# Patient Record
Sex: Male | Born: 1950 | Race: Black or African American | Hispanic: No | Marital: Married | Smoking: Former smoker
Health system: Southern US, Community
[De-identification: ages and names within clinical notes are randomized; demographics above are authoritative.]

## PROBLEM LIST (undated history)

## (undated) DIAGNOSIS — E119 Type 2 diabetes mellitus without complications: Secondary | ICD-10-CM

## (undated) DIAGNOSIS — R0602 Shortness of breath: Secondary | ICD-10-CM

## (undated) DIAGNOSIS — E876 Hypokalemia: Secondary | ICD-10-CM

## (undated) DIAGNOSIS — K766 Portal hypertension: Secondary | ICD-10-CM

## (undated) DIAGNOSIS — N189 Chronic kidney disease, unspecified: Secondary | ICD-10-CM

## (undated) DIAGNOSIS — N529 Male erectile dysfunction, unspecified: Secondary | ICD-10-CM

## (undated) DIAGNOSIS — I2510 Atherosclerotic heart disease of native coronary artery without angina pectoris: Secondary | ICD-10-CM

## (undated) DIAGNOSIS — R188 Other ascites: Secondary | ICD-10-CM

## (undated) DIAGNOSIS — D631 Anemia in chronic kidney disease: Secondary | ICD-10-CM

## (undated) DIAGNOSIS — I8500 Esophageal varices without bleeding: Secondary | ICD-10-CM

## (undated) DIAGNOSIS — N2581 Secondary hyperparathyroidism of renal origin: Secondary | ICD-10-CM

## (undated) DIAGNOSIS — K279 Peptic ulcer, site unspecified, unspecified as acute or chronic, without hemorrhage or perforation: Secondary | ICD-10-CM

## (undated) DIAGNOSIS — I959 Hypotension, unspecified: Secondary | ICD-10-CM

## (undated) DIAGNOSIS — Z992 Dependence on renal dialysis: Secondary | ICD-10-CM

## (undated) DIAGNOSIS — N289 Disorder of kidney and ureter, unspecified: Secondary | ICD-10-CM

## (undated) DIAGNOSIS — D649 Anemia, unspecified: Secondary | ICD-10-CM

## (undated) DIAGNOSIS — K922 Gastrointestinal hemorrhage, unspecified: Secondary | ICD-10-CM

## (undated) DIAGNOSIS — L299 Pruritus, unspecified: Secondary | ICD-10-CM

## (undated) DIAGNOSIS — K7460 Unspecified cirrhosis of liver: Secondary | ICD-10-CM

## (undated) DIAGNOSIS — E162 Hypoglycemia, unspecified: Secondary | ICD-10-CM

## (undated) DIAGNOSIS — R519 Headache, unspecified: Secondary | ICD-10-CM

## (undated) DIAGNOSIS — I10 Essential (primary) hypertension: Secondary | ICD-10-CM

## (undated) DIAGNOSIS — B1920 Unspecified viral hepatitis C without hepatic coma: Secondary | ICD-10-CM

## (undated) DIAGNOSIS — M810 Age-related osteoporosis without current pathological fracture: Secondary | ICD-10-CM

## (undated) DIAGNOSIS — I739 Peripheral vascular disease, unspecified: Secondary | ICD-10-CM

## (undated) DIAGNOSIS — N186 End stage renal disease: Secondary | ICD-10-CM

## (undated) DIAGNOSIS — R51 Headache: Secondary | ICD-10-CM

## (undated) HISTORY — PX: ESOPHAGOGASTRODUODENOSCOPY: SHX1529

## (undated) HISTORY — PX: CORONARY ARTERY BYPASS GRAFT: SHX141

---

## 2005-05-08 DIAGNOSIS — B1920 Unspecified viral hepatitis C without hepatic coma: Secondary | ICD-10-CM

## 2005-05-08 HISTORY — DX: Unspecified viral hepatitis C without hepatic coma: B19.20

## 2006-06-05 ENCOUNTER — Inpatient Hospital Stay (HOSPITAL_COMMUNITY): Admission: EM | Admit: 2006-06-05 | Discharge: 2006-06-09 | Payer: Self-pay | Admitting: Nephrology

## 2006-06-08 ENCOUNTER — Ambulatory Visit: Payer: Self-pay | Admitting: Vascular Surgery

## 2006-06-08 HISTORY — PX: DIALYSIS FISTULA CREATION: SHX611

## 2006-11-02 ENCOUNTER — Ambulatory Visit: Payer: Self-pay | Admitting: Vascular Surgery

## 2006-11-06 HISTORY — PX: OTHER SURGICAL HISTORY: SHX169

## 2006-11-16 ENCOUNTER — Ambulatory Visit: Payer: Self-pay | Admitting: Vascular Surgery

## 2006-11-26 ENCOUNTER — Ambulatory Visit (HOSPITAL_COMMUNITY): Admission: RE | Admit: 2006-11-26 | Discharge: 2006-11-26 | Payer: Self-pay | Admitting: Vascular Surgery

## 2006-11-26 ENCOUNTER — Ambulatory Visit: Payer: Self-pay | Admitting: Vascular Surgery

## 2007-01-07 HISTORY — PX: COLONOSCOPY W/ POLYPECTOMY: SHX1380

## 2008-03-08 DIAGNOSIS — I8500 Esophageal varices without bleeding: Secondary | ICD-10-CM

## 2008-03-08 HISTORY — DX: Esophageal varices without bleeding: I85.00

## 2009-02-05 HISTORY — PX: THROMBECTOMY AND REVISION OF ARTERIOVENTOUS (AV) GORETEX  GRAFT: SHX6120

## 2009-03-03 ENCOUNTER — Ambulatory Visit (HOSPITAL_COMMUNITY): Admission: RE | Admit: 2009-03-03 | Discharge: 2009-03-03 | Payer: Self-pay | Admitting: Vascular Surgery

## 2009-03-03 ENCOUNTER — Ambulatory Visit: Payer: Self-pay | Admitting: Vascular Surgery

## 2009-04-20 ENCOUNTER — Ambulatory Visit: Payer: Self-pay | Admitting: Vascular Surgery

## 2009-05-08 IMAGING — CR DG CHEST 2V
2 series · 2 of 2 positions shown · non-contrast
Comparison: 06/07/2006.

CHEST - 2 VIEW
CLINICAL DATA: End-stage renal disease, preoperative for dialysis graft.

[view not recorded (1 of 2)]
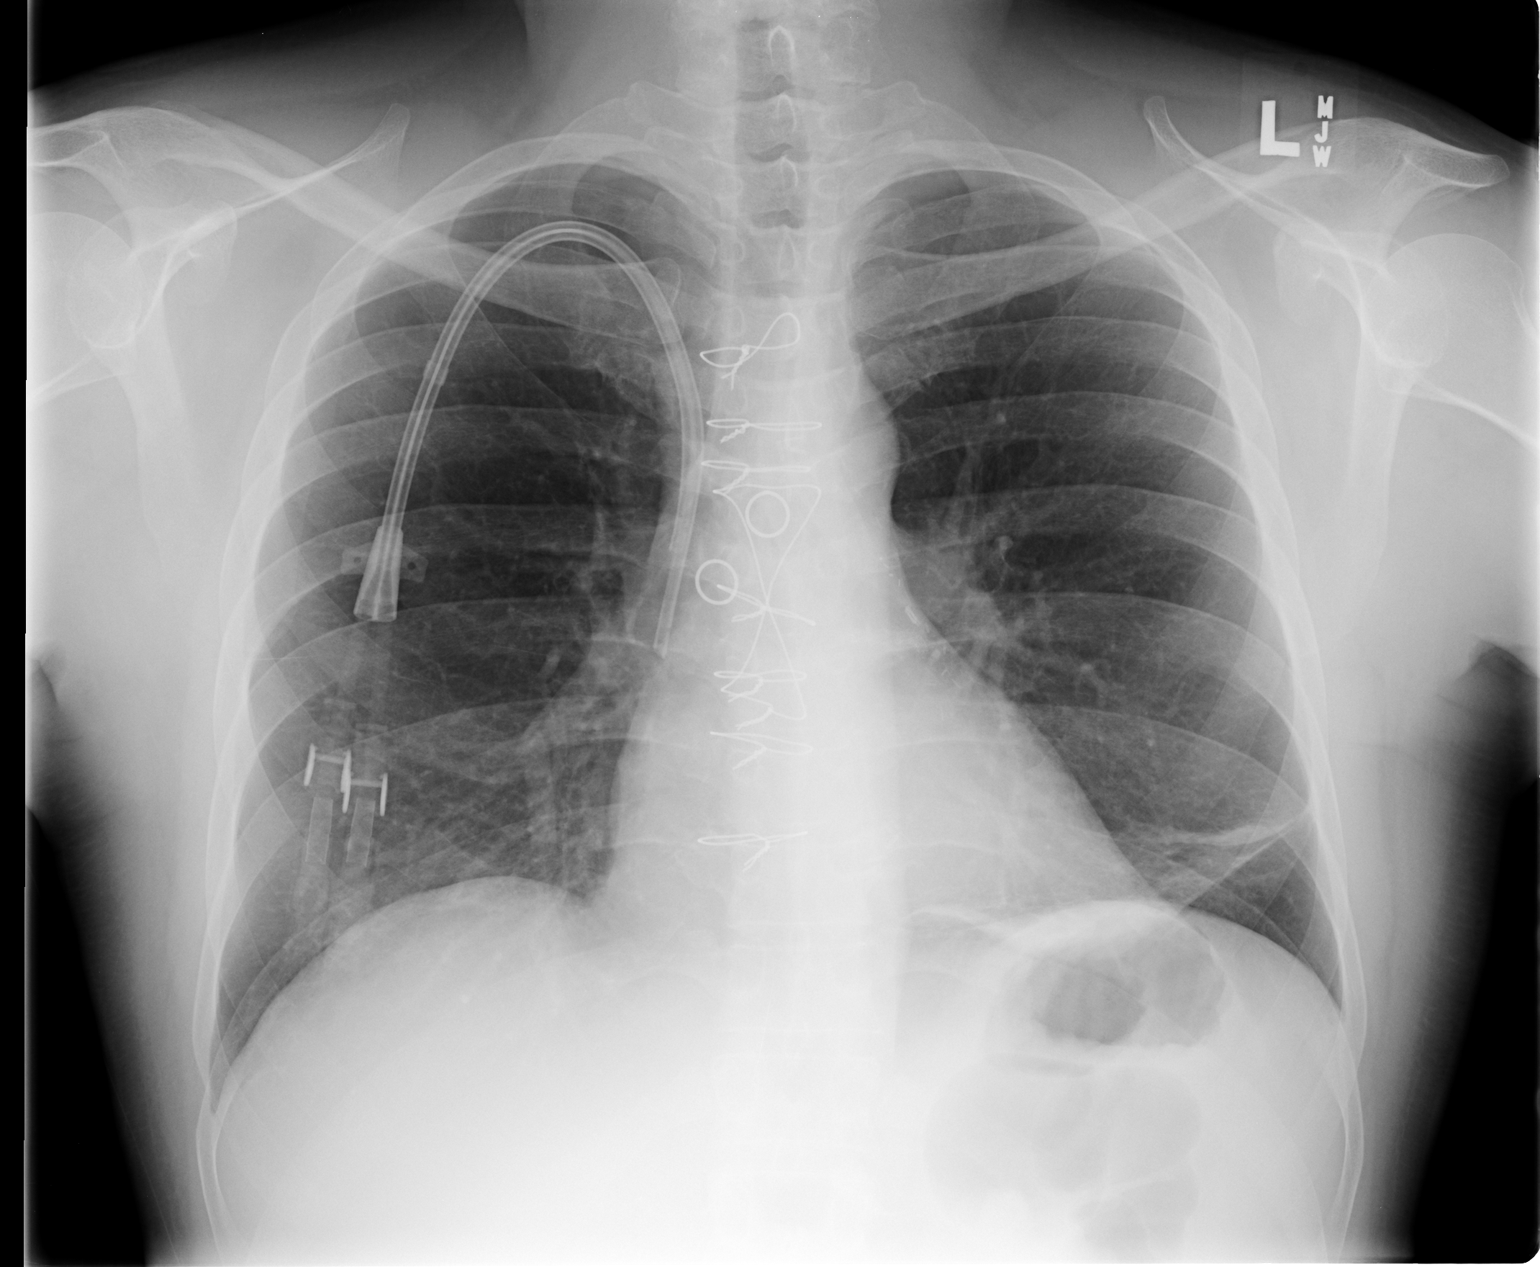

[view not recorded (2 of 2)]
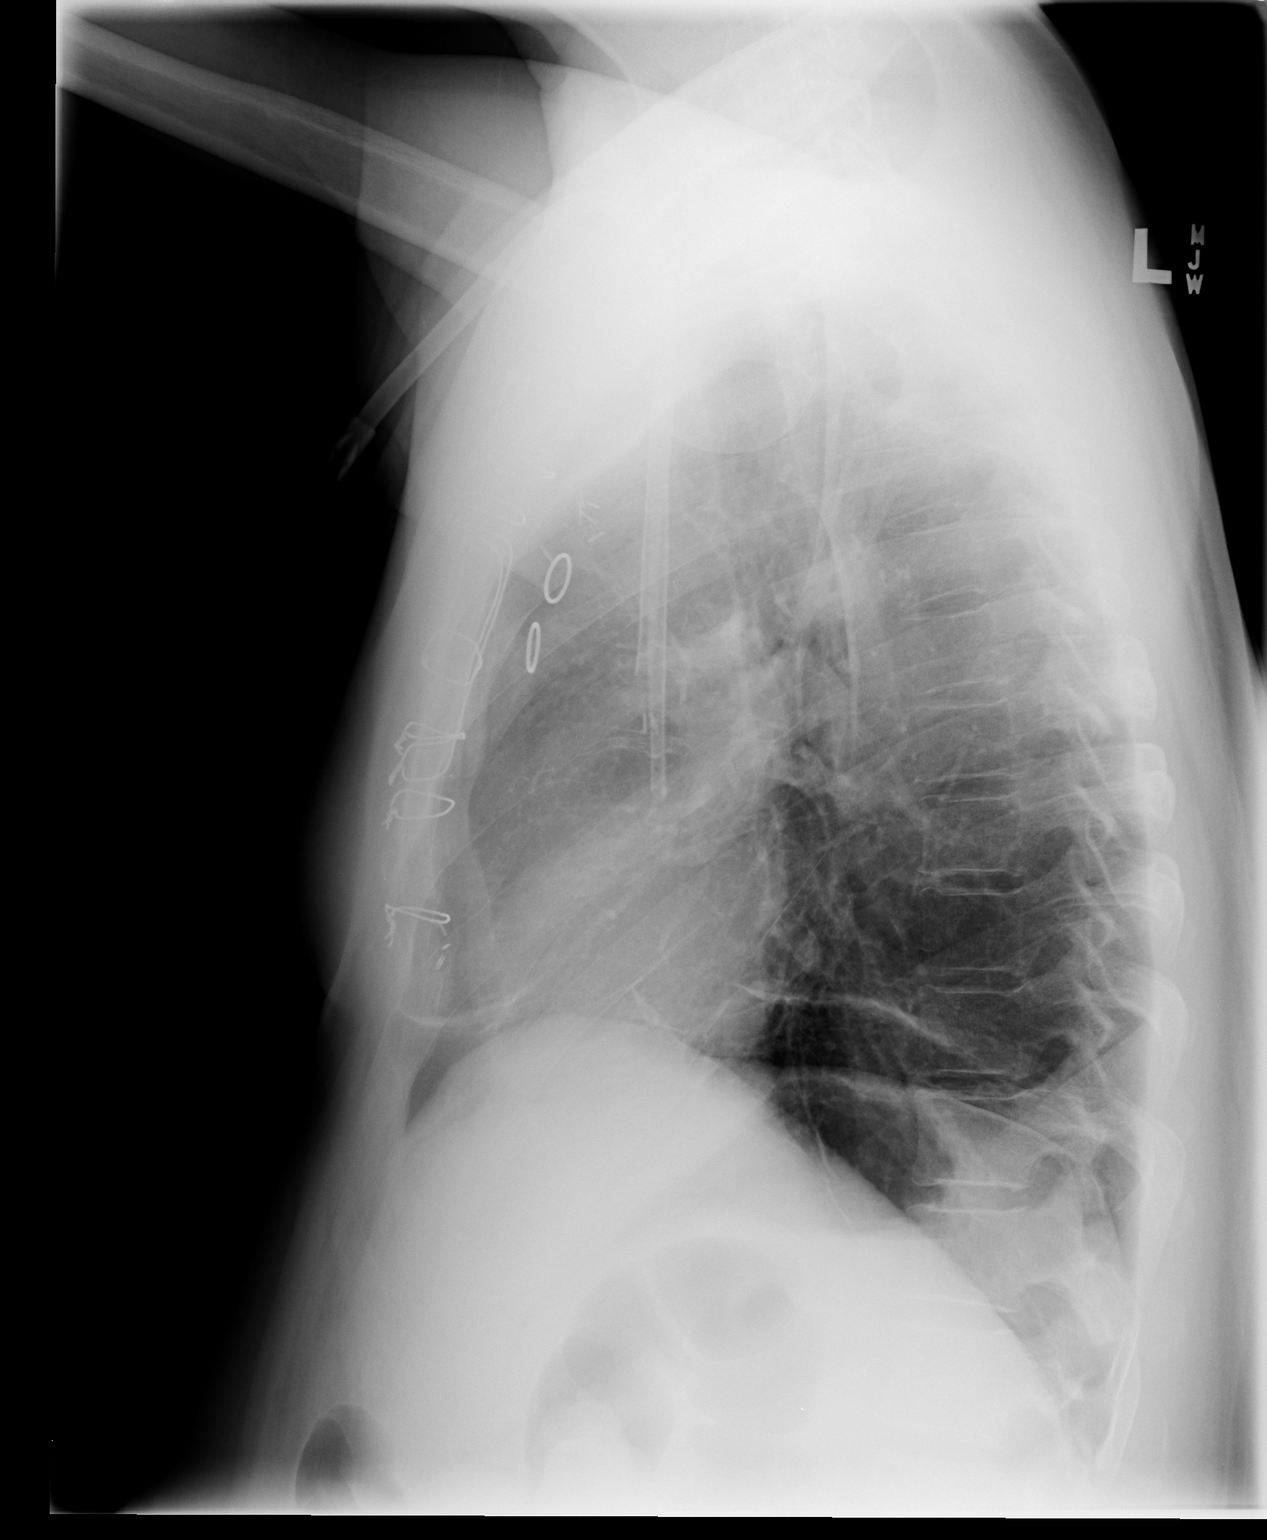

[2 of 2 positions shown; findings below may reference images not displayed]

FINDINGS: Right IJ dialysis catheter, with distal tip terminating in mid to
lower superior vena cava. Left basilar linear atelectasis, slightly improved
from prior examination. Median sternotomy/CABG. No airspace disease, edema,
pleural effusions, or pneumothorax.
**********************************************************************
IMPRESSION

1. Stable support apparatus and postoperative changes.
2. Left basilar linear atelectasis/scar. 
**********************************************************************

## 2010-08-11 LAB — GLUCOSE, CAPILLARY: Glucose-Capillary: 149 mg/dL — ABNORMAL HIGH (ref 70–99)

## 2010-08-11 LAB — POCT I-STAT 4, (NA,K, GLUC, HGB,HCT)
Glucose, Bld: 134 mg/dL — ABNORMAL HIGH (ref 70–99)
HCT: 41 % (ref 39.0–52.0)
Potassium: 5.1 mEq/L (ref 3.5–5.1)

## 2010-09-20 NOTE — Assessment & Plan Note (Signed)
OFFICE VISIT   [name], [name]  DOB:  02-Oct-1950                                       11/02/2006  EAVWU#:[id number]   Mr. [name] is a Tuesday, Thursday, Saturday dialysis patient.  He had a  left radial cephalic AV fistula created by Dr Arbie Cookey in February of 2008.  Apparently, the fistula has not been working well, but he was unable to  give details regarding this today.  There were no notes available  regarding the function of this fistula.  He apparently had a shuntogram  done at [facility] recently, but the report is difficult to  understand, and I have requested that he return for an office visit in  two weeks time at which time he will bring his films with him.   Derek Hora. Fields, MD  Electronically Signed   CEF/MEDQ  D:  11/04/2006  T:  11/05/2006  Job:  [id number]

## 2010-09-20 NOTE — Op Note (Signed)
[name], [name]               ACCOUNT NO.:  [id number]   MEDICAL RECORD NO.:  [id number]          PATIENT TYPE:  AMB   LOCATION:  SDS                          FACILITY:  [facility]   PHYSICIAN:  Janetta Hora. Fields, MD  DATE OF BIRTH:  [birth year]   DATE OF PROCEDURE:  11/26/2006  DATE OF DISCHARGE:                               OPERATIVE REPORT   PROCEDURE:  Left forearm AV graft.   PREOPERATIVE DIAGNOSIS:  End-stage renal disease.   POSTOPERATIVE DIAGNOSIS:  End-stage renal disease.   ANESTHESIA:  General.   ASSISTANT:  Zenaida Niece, RNFA.   OPERATIVE FINDINGS:  1. A 6 mm PTFE.  2. Severe spasm left brachial artery.  3. Venous anastomosis to basilic vein.   OPERATIVE DETAILS:  After obtaining informed consent, the patient taken  to the operating.  The patient placed in supine position on the  operating table.  After induction of general anesthesia and endotracheal  elevation, the patient's entire left upper extremity was prepped and  draped usual sterile fashion.  Longitudinal incision was made just above  the antecubital crease.  The basilic vein was dissected free  circumferentially in this location.  It was of good quality  approximately three mm in diameter.  It was dissected free  circumferentially and small side branches ligated divided between silk  ties.  Brachial artery was then dissected free in the medial portion of  the incision.  This was fairly small, approximately 2 mm in diameter,  and in fairly severe spasm.  It was dissected free circumferentially and  small side branches ligated and divided between silk ties.  Next, a  subcutaneous tunnel was created in a loop configuration down the forearm  with a transverse incision in the distal forearm for assistance in  tunneling.  A 6 mm PTFE graft was then brought through the subcutaneous  tunnel.  The patient was then given 5000 units of intravenous heparin.  The artery was controlled proximally and distally  with fine bulldog  clamps and a longitudinal arteriotomy made.  The graft was slightly  beveled and sewn end of graft to side of artery using a running 6-0  Prolene suture.  Just prior to completion anastomosis, this was  forebled, backbled and thoroughly flushed.  Anastomosis was secured,  clamps were released, there was pulsatile flow in the graft immediately.  The graft was then cut to length for the venous anastomosis.  The vein  was controlled proximally and distally with fine bulldog clamps.  Longitudinal venotomy was made.  Graft was beveled and sewn end graft to  side of vein using a running 6-0 Prolene suture.  Just prior to  completion anastomosis, this was  forebled, backbled and thoroughly  flushed.  Anastomosis was secured, clamps released, there was a palpable  thrill above the graft in the basilic system immediately.  Hemostasis  was obtained with thrombin and Gelfoam.  Subcutaneous tissues of both  incisions were  reapproximated using running 3-0 Vicryl suture.  Skin of both incisions  were closed with 4-0 Vicryl subcuticular stitch.  The patient tolerated  the procedure well  and there were no complications.  Instrument, sponge,  and needle counts correct at the end of the case.  The patient was taken  to the recovery room in stable condition.      Janetta Hora. Fields, MD  Electronically Signed     CEF/MEDQ  D:  11/26/2006  T:  11/26/2006  Job:  [id number]

## 2010-09-20 NOTE — Assessment & Plan Note (Signed)
OFFICE VISIT   CHEZ, BULNES  DOB:  1951-03-24                                       04/20/2009  ZOXWR#:[id number]   The patient was referred today because of vascular access issues in his  left upper extremity.  This gentleman has had initially a left forearm  AV fistula created by Dr. Arbie Kane in February of 2007.  The fistula did  not mature well and initially was tried, it was attempted to use it  which was unsuccessful and he had a left forearm graft placed by Dr.  Darrick Kane in July of 2008.  Subsequently the graft has clotted in the left  forearm and has been revised but the fistula in the left forearm in the  meantime has become larger and more prominent and is being utilized for  hemodialysis successfully.  There is concern about where the loop graft  which is currently occluded crosses the AV fistula in the left mid  forearm and as to whether any part of the graft should be removed.  The  patient denies any symptoms of steal.  He has had no chest pain, dyspnea  on exertion, hemoptysis or other pulmonary type problems.   SOCIAL HISTORY:  He is married and is retired.  He does not smoke since  1980.   PHYSICAL EXAMINATION:  Vital signs:  Blood pressure 179/86, heart rate  75, respirations 14, temperature 97.8.  Chest:  Clear to auscultation.  Cardiovascular:  Regular rhythm.  No murmurs.  Upper extremity exam on  the left reveals the fistula to be widely patent from the wrist to the  antecubital area of large caliber with a good strong thrill.  The graft  is pulseless and intersects the fistula crossing superior to it.   I do not think there will be any problem leaving the old graft in place  and I would utilize the fistula as you are doing.  There is no reason to  remove this graft as it may damage the fistula and it is unnecessary to  do that unless it starts causing obstruction.   [name] [name], M.D.  Electronically Signed   JDL/MEDQ  D:   04/20/2009  T:  04/21/2009  Job:  [id number]

## 2010-09-20 NOTE — Assessment & Plan Note (Signed)
OFFICE VISIT   [name], [name]  DOB:  10-Apr-1951                                       11/16/2006  ZOXWR#:[id number]   The patient presents today for re-evaluation of possible new access.  He  currently has a left radial cephalic AV fistula which was placed in  February 2008, by Dr. Arbie Cookey.  Apparently the flow in this fistula has  not been very good and recent fistulogram showed fairly diseased  cephalic vein from the upper arm and drainage primarily into the basilic  system.   PHYSICAL EXAMINATION:  Vital signs:  Blood pressure is 125/76, heart  rate is 75 and regular.  Vascular:  There is an easily palpable thrill  in the fistula.  It is approximately 2-to-2.5-cm in diameter.  He has a  2+ brachial pulse.  He has an easily palpable thrill on the fistula.  The proximal portion of the basilic vein is easily visible.   The shuntogram also shows the basilic vein looks to be of good quality  up into the upper arm.   I had a discussion with [name] today about placing a basilic vein  transposition fistula versus a left forearm AV graft.  He wants to think  about which option he would consider most beneficial to him.  I  discussed with him the risks, benefits, possible complications of both  options today.  I emphasized to him that the fistula would probably have  longer durability but it may not mature long term.  I also discussed  with him that the graft would have a higher risk of  thrombosis and infection.  He will be scheduled for 1 of these 2  procedures on November 26, 2006.  I will discuss with him that morning which  procedure he would prefer.   Janetta Hora. Fields, MD  Electronically Signed   CEF/MEDQ  D:  11/16/2006  T:  11/19/2006  Job:  [id number]   cc:   [facility]

## 2010-09-23 NOTE — H&P (Signed)
[name], [name] NO.:  [id number]   MEDICAL RECORD NO.:  [id number]          PATIENT TYPE:  INP   LOCATION:  5524                         FACILITY:  [facility]   PHYSICIAN:  Terrial Rhodes, M.D.DATE OF BIRTH:  [birth year]   DATE OF ADMISSION:  06/05/2006  DATE OF DISCHARGE:                              HISTORY & PHYSICAL   History of a:  See well leg and 12/08/2014 renal admission history and  physical on patient normal per hour Zyprexa number [id number] the day of  admission 07/06/2006 chief complaint of swelling shortness of breath,  nausea and vomiting.   HISTORY OF PRESENT ILLNESS:  [name] is a 60 year old African-  American male with multiple medical problems most notable for coronary  artery disease. status post bypass graft surgery in February 8007,  hypertension, diabetes, hepatitis C positive. and chronic kidney  disease, advanced, stage IV, as well as anemia, who presented to  [facility] ER with a 42-month history of increasing lower extremity edema,  shortness of breath, malaise, fatigue, nausea, vomiting, and anorexia.  The patient was seen in the emergency room at [facility] in  November 2007 complaining of similar things at that time, and he was  noted to have a BUN of 83, a creatinine of 5.4, and a potassium of 5.6,  as well as a hemoglobin of 9.8. However, he was given antiemetics and  was discharged and was supposed to follow up at the [facility]. He was  also noted to have severe hypertension, with blood pressure of 203/116  on arrival, and he has not been referred for any other care but  followup, but has been going to the [facility], where he was getting  his blood pressure under control.  However, he has had continued decline  in his coarse, and his wife finally took him to the emergency room  because he was getting no better despite having good blood pressure  control.  His labs in the ER there were significant for a potassium  of  6.4, BUN of 134, and a creatinine of 9.9, and a bicarbonate of 11.  It  was decided that we would admit the patient for transfer and initiation  of hemodialysis.   The patient reports that he was following with a nephrologist in  [place] up until he left in September 2007 or October 2007, who had  told him that he was soon to need dialysis.  However, a graft officially  had not been placed, and he had not set up nephrology care here in [place] since moving back.  He has no known drug allergies.   PAST MEDICAL HISTORY:  1. Coronary artery disease, status post bypass graft surgery in      February 2007 in [place].  2. Hypertension.  3. Diabetes mellitus for 18 years.  4. Chronic kidney disease, progressive, unclear etiology.  5. Anemia.  6. Hepatitis C positive since 1985.  Unclear how he transmitted the      virus.  7. Hypercholesterolemia   MEDICATIONS:  1. Zocor 20 mg a day.  2. Lisinopril 20  mg a day.  3. DynaCirc CR 10 mg a day.  4. Novolin 70/30, 45 units in the morning, 30 in the evening.  5. Bumex 1 mg a day.  6. Amlodipine 10 mg a day.  7. Reglan 10 with each meal.  8. Aspirin 81 mg a day.  9. Coreg 25 mg b.i.d.   FAMILY HISTORY:  His mother is alive at age 34. She has diabetes and  hypertension.  Father died at age 59 from complications of a motor  vehicle accident.  He was a smoker and had COPD.  He has five sisters  and three brothers.  No one in the family with kidney disease.   SOCIAL HISTORY:  He is originally from [place], [place].  He lives in  [place], [place], with his wife.  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  He has four children, three boys, one girl.  One son had to have a heart transplant due to a viral cardiomyopathy.  The others are alive and well.  He has a 20 pack-year tobacco history  but quit 20 years ago. Quit drinking 20 years ago, and denies any drug  use.   REVIEW OF SYSTEMS:  As per HPI.   GENERAL:  He has had chills and malaise  and weight gain, as well as fatigue and anorexia.  HEENT: No headaches,  tinnitus, dysphagia, odynophagia. CARDIAC:  No chest pains,  palpitations, or orthopnea, but has had PND.  PULMONARY:  He has had  shortness of breath, dyspnea on exertion, increasing lower extremity  edema, and cough that is nonproductive.  GI:  He has had nausea,  vomiting, diarrhea, increasing abdominal girth.  No hematochezia,  melena, or bright red blood per rectum.  GU: No dysuria, pyuria,  hematuria, urgency, frequency, or retention.  RHEUMATOLOGIC: No  arthralgias or myalgias.  DERMATOLOGIC:  He has had some pruritus.  HEMATOLOGIC:  No abnormal bleeding or bruising.  All other systems  negative.   PHYSICAL EXAMINATION:  GENERAL:  He is a well-developed, well-nourished  man in mild distress.  VITAL SIGNS:  Temperature 97.7, pulse 79, blood pressure 132/62,  respiratory rate 22, pulse oximetry 94% on room air  HEENT:  Head normocephalic, atraumatic.  Pupils equal, round, and  reactive to light.  Extraocular muscles intact, no icterus.  Oropharynx  without lesions.  NECK:  Supple, with full range of motion.  No lymphadenopathy or bruits.  LUNGS:  Clear to auscultation and percussion bilaterally.  No rales or  rhonchi.  CARDIAC:  Regular rate and rhythm.  No precordial rub appreciated.  ABDOMINAL:  Normoactive bowel sounds, soft, nontender, nondistended.  No  guarding or rebound.  EXTREMITIES:  He had 3+ pitting edema bilaterally to his lower  extremities.  NEUROLOGIC:  He was awake, alert, and oriented x3.  Cranial nerves II-  XII are intact.  He did not have asterixis, but did have tremors.  DERMATOLOGIC: He had a dry, scaly patches on his abdomen, but no  telangiectasias.   Chest x-ray showed some bibasilar atelectasis.   LABORATORIES:  His white blood cell count was 6.1, hemoglobin 7.3, platelets 88.  Sodium 141, potassium 6.4, chloride 119, CO2 11, BUN 134,   creatinine 9.9, glucose 49. His ABG showed pH 7.26, PCO2 24, PO2 of 87.  His AST was 71, ALT 58, alk phos 89, total bilirubin less than 0.1.  CPK  was 1406, his MB was 10.8, relative index 0.8, troponin was 0.02. His  ammonia was 32.2.  His INR was 1.1.  Calcium was 7.2, albumin was 2.7.  His EKG showed normal sinus rhythm, no peaked T-waves.   ASSESSMENT AND PLAN:  1. Uremia.  Patient has had progressive loss of his renal function      over the last 82-month period nd now has been hyperkalemia,      metabolic acidosis, and volume excess.  He was treated for his      hyperkalemia.  He does not have a ride.  We will plan for a      PermCath in the morning and initiate hemodialysis tomorrow as long      as his potassium is improved with his current treatment.  However,      if he has persistent hyperkalemia, we will place a temporary      catheter and initiate dialysis tonight.  The underlying etiology is      unclear.  There are multiple possibilities, including diabetes,      hepatitis, hypertension, renovascular disease, atheroembolic      disease, or glomerulonephritis.  Will check his urinalysis.      However, given the chronicity of the disease, I doubt there is any      reversible, and we will just plan to move ahead in preparing for      long-term dialysis.  2. Hyperkalemia.  As above, he has been treated with bicarbonate,      Lasix, and Kayexalate.  We will recheck and follow him.  Plan for      dialysis in the morning.  3. Anasarca.  We will check urinalysis, urine protein.  We will start      him on IV Lasix until we can perform ultrafiltration with dialysis.      We may want to consider an echocardiogram, given his increased B-      type natruretic peptide and his history of coronary disease.  We      will continue to follow for now.  4. Hypertension.  His blood pressures are stable. However we will hold      his lisinopril for now, and will cut down to only one calcium       channel blocker instead of two, and continue to follow after      dialysis with ultrafiltration.  5. Anemia.  This is likely due to chronic kidney disease, but will      guaiac his stools, check iron levels, and start EPO, and Aranesp      200 mcg subcutaneously each week.  6. Hepatitis C positive.  He has an increased AST.  Will check a      hepatitis panel and follow. His INR is stable.  His ammonia level      was borderline, and we will continue to follow that.  7. Coronary artery disease.  He is currently stable.  We will continue      rule out for MI.  8. Vascular access.  Will need a PermCath in the morning and then an      AV fistula or AV graft.  9. Elevated CPKs.  Will hold his Zocor for now, as this may be the      cause.  10.Thrombocytopenia.  It is possibly related to his liver disease.  We      will continue to follow.  11.Disposition.  The patient will need outpatient dialysis in [place]     once he is stable for discharge.  ______________________________  Terrial Rhodes, M.D.     JC/MEDQ  D:  06/05/2006  T:  06/06/2006  Job:  [id number]

## 2010-09-23 NOTE — Op Note (Signed)
NAMESYLER, NORCIA NO.:  [id number]   MEDICAL RECORD NO.:  [id number]          PATIENT TYPE:  INP   LOCATION:  5524                         FACILITY:  [facility]   PHYSICIAN:  Larina Earthly, M.D.    DATE OF BIRTH:  06/01/1950   DATE OF PROCEDURE:  06/08/2006  DATE OF DISCHARGE:                               OPERATIVE REPORT   PREOPERATIVE DIAGNOSIS:  End-stage renal disease.   POSTOPERATIVE DIAGNOSIS:  End-stage renal disease.   PROCEDURE:  Creation of left Cimino arteriovenous fistula.   SURGEON:  Larina Earthly, M.D.   ASSISTANT:  Rowe Clack, P.A.-C.   ANESTHESIA:  MAC.   COMPLICATIONS:  None.   DISPOSITION:  To recovery room, stable.   PROCEDURE IN DETAIL:  The patient was taken to the operating room and  placed in supine position, where the area of the left arm and left wrist  was prepped and draped in the usual sterile fashion.  Using local  anesthesia, an incision was made between the level of the cephalic vein  and the radial artery at the wrist.  The cephalic vein was mobilized  proximally and distally and tributary branches were ligated with 3-0 and  4-0 silk ties and divided.  The vein was ligated distally and divided  and mobilized to the level of the radial artery.  The patient had a good  caliber of cephalic vein and a normal radial artery.  The artery was  occluded proximally and distally and was opened with an 11 blade and  extended longitudinally with a Potts scissors.  The vein was cut to the  appropriate length, spatulated and sewn end to side to the artery with a  running 6-0 Prolene suture.  The clamps were removed from the artery and  an excellent thrill was noted.  The wounds were irrigated with saline.  Hemostasis with electrocautery.  The wounds were closed with 3-0 Vicryl  in the subcutaneous, subcuticular tissue.  Benzoin and Steri-Strips were  applied.      Larina Earthly, M.D.  Electronically Signed     TFE/MEDQ  D:   06/08/2006  T:  06/08/2006  Job:  [id number]

## 2010-09-23 NOTE — Discharge Summary (Signed)
[name], [name] NO.:  [id number]   MEDICAL RECORD NO.:  [id number]          PATIENT TYPE:  INP   LOCATION:  5524                         FACILITY:  [name]   PHYSICIAN:  [name], M.D.DATE OF BIRTH:  07-13-1950   DATE OF ADMISSION:  06/05/2006  DATE OF DISCHARGE:                               DISCHARGE SUMMARY   DISCHARGE DIAGNOSES:  1. End-stage renal disease, estimated GFR of 9 secondary to chronic      disease status with diabetes and hypertension.  Patient is to begin      dialysis treatments at [name] on Tuesday, Thursday      and Saturday.  2. Coronary artery disease, status post CABG in 2007.  3. Status post new left forearm AV fistula placement on June 08, 2006.  4. Perm catheter placed on June 06, 2006 by radiography.  5. Diabetes mellitus, insulin requiring.  6. Uremia.  7. Hepatitis C positive.  8. Anemia.  9. Hypertension.  10.Hypercholesterolemia.   DISCHARGE MEDICATIONS:  Include:  1. Nephrovite 1 tablet daily.  2. Calcium carbonate 2 tabs 1000 mg total three times a day with      meals.  3. Coreg 12.5 mg 1 tab twice a day, hold if his systolic blood      pressure is less than 110.  4. Tylenol 650 mg as needed for pain with no more than 4 g in 24      hours.  5. Zocor 20 mg by mouth at night.  6. Novolin 70/30 at 45 units in the a.m., 25 units in the p.m.  7. Aspirin 81 mg.  Patient is to hold this for two weeks status post      discharge and then resume back.   DIET:  Patient is to do a renal diet as directed and limit his fluid  intake to 1200 mL a day.   ACTIVITY:  His activity is as tolerated.  His wound care:  He is not  allowed to shower until his Perm catheter is out.  His goal estimated  dry weight is to be determined.   FOLLOWUP APPOINTMENT:  Patient is to follow up with [name] on Tuesday, Thursday and Saturday, at 10 a.m. on Tuesday,  June 12, 2006, telephone  number [phone], fax number [phone].   HEMODIALYSIS ORDERS:  Is to be 2.0 potassium of 3.5 hours x 2 then 4  hours.  Estimated dry weight is to be determined.  They are to take 2 L  off.  His access is currently a Perm catheter.  Epo is to be 15,000  units IV with hemodialysis, vitamin D, 1 mcg Zemplar with hemodialysis.  No iron.  No antibiotics.   DISCHARGE LABS:  Include hemoglobin 9.6, potassium of 4.4, phosphorous  7.5, calcium 6.8, parathyroid hormone of 267, albumin of 2.1.  Cultures:  Her urine culture and blood culture x2 were both negative.  Total  bilirubin is 0.5, alkaline phosphatase 64, AST of 53, ALT of 41, total  protein 5.7, white count of 5.1, platelets  of 71, creatinine of 7.47,  BUN of 105.   BRIEF HOSPITAL COURSE:  Patient is a 60 year old African American male  with multiple medical problems most notable for his chronic kidney  disease and coronary artery disease.  He was admitted on June 05, 2006 by the [name] for uremic presentation.  Patient  was seen in the emergency room at [name] and then transferred  over here.  Please see the dictated H&P for further details.  Patient  was admitted on January 29 and had placement of a Perm catheter on  June 06, 2006.  Patient was begun on hemodialysis in the hospital.  1. Uremia.  Patient has notably improved symptoms status post      dialysis.  He was treated for his hyperkalemia and his metabolic      acidosis as well as his nausea and vomiting.  He had notable      improvement after the hemodialysis.  It was thought his underlying      etiology for his renal disease has multiple possibilities including      diabetes, hepatitis C, hypertension, renal vascular disease,      atheroembolic disease, or glomerular nephritis.  Patient had a      permanent AV fistula in the left forearm placed on June 08, 2006      and this will be used as his long-term access.  Patient is to have       his Perm catheter removed as deemed when the AV fistula is able to      be used.  2. For his anemia, patient was started on Aranesp while in the      hospital at 200 mcg every week with hemodialysis.  He will be      transitioned over to Erythropoietin 15,000 units three times a week      with hemodialysis.  Patient's iron levels were noted to be iron of      83, TIBC of 312, percent saturation was 27 and ferritin was 173.      He will be followed with labs at the [facility].  3. Hyperkalemia, he was treated with Lasix and Kayexalate.  He      underwent dialysis and this improved.  4. For his anasarca, this is thought to be secondary to his chronic      kidney disease.  He was started with IV Lasix and ultra filtration      with dialysis with 2 L being pulled off with each dialysis.  We      will continue this until his estimated dry weight has been      determined.  5. For his hypertension, he was currently on medications when he came      in of Coreg 25 daily, lisinopril 20 mg daily, Bumex 1 mg daily,      amlodipine 10 mg daily.  These were all discontinued after      hemodialysis and the patient will only be discharged home on Coreg      12.5 mg p.o. b.i.d.  6. For his diabetes mellitus, due to the patient being a known      diabetic and on insulin-requiring therapy, his home regimen was      70/30 at 45 units in the a.m. and 25 units in the p.m.  Since the      patient was placed n.p.o., his sugars remained high and his insulin      withheld.  On the day prior to discharge, his insulin regimen was      restarted at half the dosage.  We will restart him to increase his      70/30 as tolerated back to his home dose once he resumes his      regular diet.  7. For his coronary artery disease, he is status post a CABG in 2007      in [place].  He was ruled out for acute MI during this     hospitalization.  Due to some bleeding from his catheter site, his      aspirin was held while  he was an inpatient.  We will have him hold      his baby aspirin for the next two weeks and then resume back at 81      mg daily.  8. Secondary to hyperparathyroidism, this is a new diagnosis.  His      parathyroid hormone was noted to be elevated at 267.  His total      calcium was 6.9.  His albumin was 2.1.  We will start him on      Zemplar 1 mcg each hemodialysis.  He is also to be taking calcium      carbonate 2 tabs three times a day with meals.  9. For hypercholesterolemia, the patient will be discharged on Zocor,      his home medication of 20 mg nightly.   FOLLOW UP:  The patient is to follow up with [facility] as  previously dictated at 10 a.m. on June 12, 2006.      Wilhemina Bonito, M.D.      [name], M.D.  Electronically Signed    ML/MEDQ  D:  06/08/2006  T:  06/09/2006  Job:  [id number]   cc:   [name], M.D.

## 2011-02-20 LAB — HEPATIC FUNCTION PANEL
Alkaline Phosphatase: 85
Total Bilirubin: 0.8

## 2012-05-08 DIAGNOSIS — K279 Peptic ulcer, site unspecified, unspecified as acute or chronic, without hemorrhage or perforation: Secondary | ICD-10-CM

## 2012-05-08 DIAGNOSIS — K922 Gastrointestinal hemorrhage, unspecified: Secondary | ICD-10-CM

## 2012-05-08 HISTORY — DX: Gastrointestinal hemorrhage, unspecified: K92.2

## 2012-05-08 HISTORY — DX: Peptic ulcer, site unspecified, unspecified as acute or chronic, without hemorrhage or perforation: K27.9

## 2013-03-28 ENCOUNTER — Encounter (HOSPITAL_COMMUNITY): Payer: Self-pay | Admitting: *Deleted

## 2013-03-28 ENCOUNTER — Inpatient Hospital Stay (HOSPITAL_COMMUNITY)
Admission: AD | Admit: 2013-03-28 | Discharge: 2013-03-31 | DRG: 441 | Disposition: A | Discharge status: Home / Self Care | Payer: Medicare Other | Source: Other Acute Inpatient Hospital | Attending: Internal Medicine | Admitting: Internal Medicine

## 2013-03-28 DIAGNOSIS — Z951 Presence of aortocoronary bypass graft: Secondary | ICD-10-CM

## 2013-03-28 DIAGNOSIS — I2510 Atherosclerotic heart disease of native coronary artery without angina pectoris: Secondary | ICD-10-CM | POA: Diagnosis present

## 2013-03-28 DIAGNOSIS — E119 Type 2 diabetes mellitus without complications: Secondary | ICD-10-CM | POA: Diagnosis present

## 2013-03-28 DIAGNOSIS — I739 Peripheral vascular disease, unspecified: Secondary | ICD-10-CM | POA: Diagnosis present

## 2013-03-28 DIAGNOSIS — M899 Disorder of bone, unspecified: Secondary | ICD-10-CM | POA: Diagnosis present

## 2013-03-28 DIAGNOSIS — K766 Portal hypertension: Secondary | ICD-10-CM | POA: Diagnosis present

## 2013-03-28 DIAGNOSIS — I10 Essential (primary) hypertension: Secondary | ICD-10-CM | POA: Diagnosis present

## 2013-03-28 DIAGNOSIS — Z794 Long term (current) use of insulin: Secondary | ICD-10-CM

## 2013-03-28 DIAGNOSIS — N186 End stage renal disease: Secondary | ICD-10-CM | POA: Diagnosis present

## 2013-03-28 DIAGNOSIS — I8510 Secondary esophageal varices without bleeding: Secondary | ICD-10-CM

## 2013-03-28 DIAGNOSIS — K219 Gastro-esophageal reflux disease without esophagitis: Secondary | ICD-10-CM | POA: Diagnosis present

## 2013-03-28 DIAGNOSIS — Z8711 Personal history of peptic ulcer disease: Secondary | ICD-10-CM

## 2013-03-28 DIAGNOSIS — Z7982 Long term (current) use of aspirin: Secondary | ICD-10-CM

## 2013-03-28 DIAGNOSIS — Z87891 Personal history of nicotine dependence: Secondary | ICD-10-CM

## 2013-03-28 DIAGNOSIS — K3184 Gastroparesis: Secondary | ICD-10-CM | POA: Diagnosis present

## 2013-03-28 DIAGNOSIS — K7460 Unspecified cirrhosis of liver: Secondary | ICD-10-CM

## 2013-03-28 DIAGNOSIS — E1149 Type 2 diabetes mellitus with other diabetic neurological complication: Secondary | ICD-10-CM | POA: Diagnosis present

## 2013-03-28 DIAGNOSIS — K922 Gastrointestinal hemorrhage, unspecified: Secondary | ICD-10-CM

## 2013-03-28 DIAGNOSIS — Z992 Dependence on renal dialysis: Secondary | ICD-10-CM

## 2013-03-28 DIAGNOSIS — I8511 Secondary esophageal varices with bleeding: Secondary | ICD-10-CM | POA: Diagnosis present

## 2013-03-28 DIAGNOSIS — D631 Anemia in chronic kidney disease: Secondary | ICD-10-CM | POA: Diagnosis present

## 2013-03-28 DIAGNOSIS — K319 Disease of stomach and duodenum, unspecified: Secondary | ICD-10-CM | POA: Diagnosis present

## 2013-03-28 DIAGNOSIS — I120 Hypertensive chronic kidney disease with stage 5 chronic kidney disease or end stage renal disease: Secondary | ICD-10-CM | POA: Diagnosis present

## 2013-03-28 DIAGNOSIS — D62 Acute posthemorrhagic anemia: Secondary | ICD-10-CM | POA: Diagnosis present

## 2013-03-28 DIAGNOSIS — B1920 Unspecified viral hepatitis C without hepatic coma: Principal | ICD-10-CM | POA: Diagnosis present

## 2013-03-28 DIAGNOSIS — K3189 Other diseases of stomach and duodenum: Secondary | ICD-10-CM

## 2013-03-28 HISTORY — DX: Anemia in chronic kidney disease: D63.1

## 2013-03-28 HISTORY — DX: Chronic kidney disease, unspecified: N18.9

## 2013-03-28 HISTORY — DX: Unspecified viral hepatitis C without hepatic coma: B19.20

## 2013-03-28 HISTORY — DX: Dependence on renal dialysis: N18.6

## 2013-03-28 HISTORY — DX: Dependence on renal dialysis: Z99.2

## 2013-03-28 HISTORY — DX: Type 2 diabetes mellitus without complications: E11.9

## 2013-03-28 HISTORY — DX: Gastrointestinal hemorrhage, unspecified: K92.2

## 2013-03-28 HISTORY — DX: Essential (primary) hypertension: I10

## 2013-03-28 HISTORY — DX: Esophageal varices without bleeding: I85.00

## 2013-03-28 HISTORY — DX: Atherosclerotic heart disease of native coronary artery without angina pectoris: I25.10

## 2013-03-28 HISTORY — DX: Peptic ulcer, site unspecified, unspecified as acute or chronic, without hemorrhage or perforation: K27.9

## 2013-03-28 HISTORY — DX: Peripheral vascular disease, unspecified: I73.9

## 2013-03-28 LAB — GLUCOSE, CAPILLARY: Glucose-Capillary: 139 mg/dL — ABNORMAL HIGH (ref 70–99)

## 2013-03-28 LAB — CBC
HCT: 18 % — ABNORMAL LOW (ref 39.0–52.0)
MCH: 34.8 pg — ABNORMAL HIGH (ref 26.0–34.0)
MCHC: 35.6 g/dL (ref 30.0–36.0)
MCV: 97.8 fL (ref 78.0–100.0)
RDW: 14.5 % (ref 11.5–15.5)
WBC: 3.9 10*3/uL — ABNORMAL LOW (ref 4.0–10.5)

## 2013-03-28 LAB — TROPONIN I: Troponin I: <0.3 ng/mL (ref <0.30)

## 2013-03-28 LAB — HEMOGLOBIN A1C: Hgb A1c MFr Bld: 7.3 % — ABNORMAL HIGH (ref <5.7)

## 2013-03-28 LAB — MRSA PCR SCREENING: MRSA by PCR: NEGATIVE

## 2013-03-28 MED ORDER — SODIUM CHLORIDE 0.9 % IV SOLN: 100.0000 mL | INTRAVENOUS | Status: DC | PRN

## 2013-03-28 MED ORDER — INSULIN ASPART 100 UNIT/ML ~~LOC~~ SOLN
0.0000 [IU] | Freq: Three times a day (TID) | SUBCUTANEOUS | Status: DC
  Administered 2013-03-29: 2 [IU] via SUBCUTANEOUS
  Administered 2013-03-29: 3 [IU] via SUBCUTANEOUS
  Administered 2013-03-29: 5 [IU] via SUBCUTANEOUS
  Administered 2013-03-30: 9 [IU] via SUBCUTANEOUS
  Administered 2013-03-30: 3 [IU] via SUBCUTANEOUS

## 2013-03-28 MED ORDER — LIDOCAINE-PRILOCAINE 2.5-2.5 % EX CREA
1.0000 "application " | TOPICAL_CREAM | CUTANEOUS | Status: DC | PRN
  Filled 2013-03-28: qty 5

## 2013-03-28 MED ORDER — SODIUM CHLORIDE 0.9 % IV SOLN
8.0000 mg/h | INTRAVENOUS | Status: DC
  Filled 2013-03-28 (×2): qty 80

## 2013-03-28 MED ORDER — DARBEPOETIN ALFA-POLYSORBATE 40 MCG/0.4ML IJ SOLN
40.0000 ug | INTRAMUSCULAR | Status: DC
  Administered 2013-03-28: 40 ug via INTRAVENOUS
  Filled 2013-03-28: qty 0.4

## 2013-03-28 MED ORDER — ALTEPLASE 2 MG IJ SOLR
2.0000 mg | Freq: Once | INTRAMUSCULAR | Status: DC | PRN
  Filled 2013-03-28: qty 2

## 2013-03-28 MED ORDER — PANTOPRAZOLE SODIUM 40 MG IV SOLR: 40.0000 mg | Freq: Two times a day (BID) | INTRAVENOUS | Status: DC

## 2013-03-28 MED ORDER — SODIUM CHLORIDE 0.9 % IV SOLN
50.0000 ug/h | INTRAVENOUS | Status: DC
  Administered 2013-03-28: 50 ug/h via INTRAVENOUS
  Filled 2013-03-28 (×4): qty 1

## 2013-03-28 MED ORDER — OCTREOTIDE LOAD VIA INFUSION
50.0000 ug | Freq: Once | INTRAVENOUS | Status: AC
  Administered 2013-03-28: 50 ug via INTRAVENOUS
  Filled 2013-03-28: qty 25

## 2013-03-28 MED ORDER — PANTOPRAZOLE SODIUM 40 MG IV SOLR
40.0000 mg | Freq: Two times a day (BID) | INTRAVENOUS | Status: DC
  Administered 2013-03-28: 40 mg via INTRAVENOUS
  Filled 2013-03-28 (×3): qty 40

## 2013-03-28 MED ORDER — NEPRO/CARBSTEADY PO LIQD
237.0000 mL | ORAL | Status: DC | PRN
  Filled 2013-03-28: qty 237

## 2013-03-28 MED ORDER — CARVEDILOL 25 MG PO TABS
25.0000 mg | ORAL_TABLET | Freq: Two times a day (BID) | ORAL | Status: DC
  Administered 2013-03-29 – 2013-03-30 (×4): 25 mg via ORAL
  Filled 2013-03-28 (×7): qty 1

## 2013-03-28 MED ORDER — PENTAFLUOROPROP-TETRAFLUOROETH EX AERO: 1.0000 "application " | INHALATION_SPRAY | CUTANEOUS | Status: DC | PRN

## 2013-03-28 MED ORDER — LIDOCAINE HCL (PF) 1 % IJ SOLN: 5.0000 mL | INTRAMUSCULAR | Status: DC | PRN

## 2013-03-28 MED ORDER — AMLODIPINE BESYLATE 10 MG PO TABS
10.0000 mg | ORAL_TABLET | Freq: Every day | ORAL | Status: DC
  Administered 2013-03-29 – 2013-03-31 (×3): 10 mg via ORAL
  Filled 2013-03-28 (×3): qty 1

## 2013-03-28 MED ORDER — HEPARIN SODIUM (PORCINE) 1000 UNIT/ML DIALYSIS: 1000.0000 [IU] | INTRAMUSCULAR | Status: DC | PRN

## 2013-03-28 MED ORDER — LOSARTAN POTASSIUM 50 MG PO TABS
100.0000 mg | ORAL_TABLET | Freq: Every day | ORAL | Status: DC
  Administered 2013-03-29 – 2013-03-30 (×2): 100 mg via ORAL
  Filled 2013-03-28 (×4): qty 2

## 2013-03-28 NOTE — Consult Note (Signed)
Renal Service Consult Note [facility]  [name] 03/28/2013 [name] Requesting Physician:  Dr Creola Corn  Reason for Consult:  ESRD patient with UGIB, hematemesis HPI: The patient is a 62 y.o. year-old with hx of HTN, CAD/CABG, DM, and cirrhosis presented to ED in [place] with N/V and coffee ground emesis for 2 days.  Hb in ED was 6.6, and pt tx'[facility] to [facility] for admission.  Hx of gastric ulcers in past, no known hx of varices.  Occ chest pains, no sob. Denies abd pain.  ROS  no jt pain  no skin rash  no confusion or hallucination  dark, tarry stools  Past Medical History  Past Medical History  Diagnosis Date  . Diabetes mellitus without complication   . Coronary artery disease   . Hypertension   . Peripheral vascular disease   . Chronic kidney disease   . GERD (gastroesophageal reflux disease)   . Hepatitis    Past Surgical History  Past Surgical History  Procedure Laterality Date  . Coronary artery bypass graft     Family History History reviewed. No pertinent family history. Social History  reports that he has quit smoking. He does not have any smokeless tobacco history on file. He reports that he does not drink alcohol or use illicit drugs. Allergies No Known Allergies Home medications Prior to Admission medications   Medication Sig Start Date End Date Taking? Authorizing Provider  amLODipine (NORVASC) 10 MG tablet Take 10 mg by mouth daily.   Yes Historical Provider, MD  aspirin EC 81 MG tablet Take 81 mg by mouth daily.   Yes Historical Provider, MD  Calcium Acetate 667 MG TABS Take 2,001 mg by mouth 3 (three) times daily with meals.   Yes Historical Provider, MD  carvedilol (COREG) 25 MG tablet Take 25 mg by mouth 2 (two) times daily with a meal.   Yes Historical Provider, MD  cloNIDine (CATAPRES) 0.1 MG tablet Take 0.1 mg by mouth at bedtime.   Yes Historical Provider, MD  insulin aspart (NOVOLOG FLEXPEN) 100 UNIT/ML SOPN FlexPen Inject 20  Units into the skin 2 (two) times daily.   Yes Historical Provider, MD  Insulin Detemir (LEVEMIR FLEXPEN) 100 UNIT/ML SOPN Inject 25 Units into the skin daily.   Yes Historical Provider, MD  losartan (COZAAR) 100 MG tablet Take 100 mg by mouth daily.   Yes Historical Provider, MD  meloxicam (MOBIC) 15 MG tablet Take 15 mg by mouth daily.   Yes Historical Provider, MD  omeprazole (PRILOSEC) 20 MG capsule Take 20 mg by mouth daily.   Yes Historical Provider, MD  traZODone (DESYREL) 50 MG tablet Take 25 mg by mouth at bedtime.   Yes Historical Provider, MD   Liver Function Tests No results found for this basename: AST, ALT, ALKPHOS, BILITOT, PROT, ALBUMIN,  in the last 168 hours No results found for this basename: LIPASE, AMYLASE,  in the last 168 hours CBC No results found for this basename: WBC, NEUTROABS, HGB, HCT, MCV, PLT,  in the last 168 hours Basic Metabolic Panel No results found for this basename: NA, K, CL, CO2, GLUCOSE, BUN, CREATININE, ALB, CALCIUM, PHOS,  in the last 168 hours  Exam  Blood pressure 123/56, pulse 70, temperature 97.8 F (36.6 C), temperature source Oral, resp. rate 16, height 5\' 4"  (1.626 m), weight 66.7 kg (147 lb 0.8 oz), SpO2 99.00%.  gen: alert, small-framed adult AAM in no distress, calm  skin: no rash, cyanosis  heent: eomi,  sclera anicteric, throat clear  neck: + jvp 12cm  chest: clear bilat to bases, no rales or wheezing  cor: regular, 2/6 soft sem rusb, pedal pulses intact bilat  abd: soft, nontender, slight distended, no ascites, liver down 3 cm  ext: no leg or UE edema, no joint effusion, no gangrene/ulcers  neuro: alert, ox3, nonfocal  access: L forearm AVF good bruit, buttonholes intact without sx's of infection  Dialysis orders: MWF [place] 3:15 hrs    F180   68kg   2K/2.5Ca    Profile 2   Heparin none  Hectorol none     Epo 2000     Venofer none Last labs: tsat 44%  Ferritin 1860  phos 2-4 range  pth 213  Assessment: 1. Hematemesis / GI  bleed / hx cirrhosis: for transfusion w dialysis, GI evaluating 2. ESRD- HD today 3. Anemia [facility]/t CKD / ABL: cont esa w darbe 40/wk, transfuse, iron stores ok as op 4. MBD (metabolic bone disease)- cont phoslo w meals 5. HTN/volume- takes norvasc, losartan, coreg and clonidine as op, cont as needed; below dry weight today 6. DM on insulin   Plan-  HD today, no UF, transfuse 2u prbc's, no hep w hd   Vinson Moselle MD  pager [phone]    cell [phone]  03/28/2013, 1:08 PM

## 2013-03-28 NOTE — Progress Notes (Signed)
Pt arrived on unit alert and oriented.  Vitals signs post arrival are documented in epic.  Pt actively receiving Protonix via IV.  MD was paged to inform pt had arrived on floor.

## 2013-03-28 NOTE — Progress Notes (Signed)
Per report from [facility], pt is allergic Ace Inhibitors, however pt and wife denied allergy.  Nurse provided Ace Inhibitor as allergy due to discrepancy.  Nurse contacted pharmacy due to scheduled Cozaar contraindication and voiced concern, pharmacy acknowledged concern and will follow up if neccessary.

## 2013-03-28 NOTE — H&P (Signed)
[facility] History and Physical   @LOGO @  JORAN KALLAL ZOX:[id number] DOB: May 18, 1950 DOA: 03/28/2013  Referring physician: [facility] PCP: Anselmo Pickler, MD  Specialists: GI, Nephrology  Chief Complaint: coffee ground emesis, melena  HPI: [name] is a 62 y.o. male has a past medical history significant for end-stage renal disease on hemodialysis Mondays Wednesdays and Fridays, liver cirrhosis due to hepatitis C (remote history of alcohol use), diagnosed in 2007 currently followed at [facility], hypertension, insulin-dependent diabetes mellitus, presents to [facility] emergency room with a chief complaint of coffee-ground emesis and melena for the past 2 days. He says his symptoms have started last Wednesday, and that was the worst day. Workup in the emergency room was pertinent for hemoglobin of 6.6, and he was transferred to [place] for GI evaluation. He states that he had an upper endoscopy "a while ago", and he also had a history of gastric ulcers. He does not report any history of varices, however it is mentioned that he did note that he has a history of varices. He takes an aspirin a day due to coronary artery disease and history of CABG. He also reports occasional chest pains for the last 2 days. He currently denies any fever or chills, has no chest pain, denies abdominal pain. Denies any lightheadedness or dizziness.  Laboratory results from prior to transfer: Hemoglobin 6.6, platelets 49. Potassium 4.7. BUN 76. Creatinine 9.4. Sodium 136. Glucose 229. INR 1.3. Total bilirubin 0.6. AST 68. ALT 43. Alkaline phosphatase. Troponin 0.05 (normal values less than 0.04). Chest x-ray without active disease. EKG sinus rhythm with mild T wave inversions in III, V3  Review of Systems: As per history of present illness, otherwise negative.  Past Medical History  Diagnosis Date  . Diabetes mellitus without complication   . Coronary artery disease   .  Hypertension   . Peripheral vascular disease   . Chronic kidney disease   . GERD (gastroesophageal reflux disease)   . Hepatitis    Past Surgical History  Procedure Laterality Date  . Coronary artery bypass graft     Social History:  reports that he has quit smoking. He does not have any smokeless tobacco history on file. He reports that he does not drink alcohol or use illicit drugs.  No Known Allergies  History reviewed. No pertinent family history.   Prior to Admission medications   Not on File   Physical Exam: Filed Vitals:   03/28/13 1154 03/28/13 1200 03/28/13 1215 03/28/13 1230  BP: 136/61 143/67 154/72 123/56  Pulse: 66 65 66 70  Temp: 97.8 F (36.6 C)     TempSrc: Oral     Resp:  17 14 16   Height: 5\' 4"  (1.626 m)     Weight: 66.7 kg (147 lb 0.8 oz)     SpO2: 99%        General:  No apparent distress  Eyes: PERRL, EOMI,   ENT: moist oropharynx  Neck: supple, no JVD  Cardiovascular: regular rate without MRG; 2+ peripheral pulses  Respiratory: CTA biL, good air movement without wheezing, rhonchi or crackled  Abdomen: soft, non tender to palpation, positive bowel sounds, no guarding, no rebound  Skin: no rashes  Musculoskeletal: Trace peripheral edema  Psychiatric: normal mood and affect  Neurologic: CN 2-12 grossly intact, MS 5/5 in all 4, alert and oriented x4, no asterixis.  Assessment/Plan Active Problems:   ESRD on dialysis   Cirrhosis of liver   GI bleed  Diabetes mellitus, type 2   HTN (hypertension)   HCV infection   GI bleed - gastroenterology has been consulted. This has been going on for 2 days, hemoglobin 6.6 on presentation, patient with coffee-ground emesis. Patient denies history of esophageal varices, however per ED note states that he has a history of that. Given that this is going on for 2 days, this is less likely. Appreciate gastroenterology input. Anemia due to #1 - we'll transfuse 2 units of packed red blood cells. Monitor  breathing status given ESRD. End-stage renal disease - nephrology has been consulted. Patient missed his dialysis today. We'll closely monitor as he will receive volume. Type 2 diabetes - start sliding scale, keep patient n.p.o. Closely monitor. He is on Levemir 40 units daily and NovoLog 20 units twice daily. Will restart if needed. Hypertension - he is to. Will restart some of his blood pressure medications. HCV Liver cirrhosis - patient denies history of encephalopathy or ascites   Diet: N.p.o. Fluids: None DVT Prophylaxis: SCDs  Code Status: Full code  Family Communication: Wife at bedside  Disposition Plan: Step down  Time spent: 88  [name] M. Elvera Lennox, MD [facility] Pager [phone]  If 7PM-7AM, please contact night-coverage [email or web address] Password [facility] 03/28/2013, 12:38 PM

## 2013-03-28 NOTE — Consult Note (Signed)
[facility] Consult: 2:59 PM 03/28/2013  LOS: 0 days    Referring Provider: Dr Lafe Garin Primary Care Physician:  Anselmo Pickler, MD Primary Gastroenterologist:  [facility]:  Dr Cam Hai and Dr Braulio Conte in [place].    Reason for Consultation:  CG emesis and melenic stool.   HPI: [name] is a 62 y.o. male.  ESRD, dialysis on MWF, dialyzes in [place].  ETOH and Hep C + cirrhosis.  Followed at [facility].  Hx of gastric ulcers, on 20 mg Omeprazole daily.   Hand written notes from dialysis state a 03/2008 EGD at [facility] showed grade 1 esophageal ulcers.  Had bleeding ulcer (melenic stool, no vomiting then) in Jan 2014.  Had EGD with small antral ulcer and gastric erosions.  Pt says he had follow up EGD with Dr Braulio Conte.  No mention of portal htn or varices at that time in renal notes.   Went to [place] ED with hx CG emesis.  Cg emesis and dark, soft stools several times from Wednesday at 5 pm to wee hours of AM Thursday. Nausea and some more dark stools but no emesis on Thursday.  Went to [facility] ED 11/20 where Hgb 6.6.  Had been 9.2 on 11/19 at HD center.  Transferred due to need for HD to [place].  To be transfused with 2 units PRBCs  Only transfusions occurred peri-op phase post CABG around 2007.  Did get iron infusions at HD in past, but these stopped at least one year ago. On TID epogen at dialysis.  No GI problems before Wednesday.  Compliant with Omeprazole.  Takes Relafen daily and Meloxicam about once a week and for left shoulder pain. Also on low dose ASA.   Past Medical History  Diagnosis Date  . DM type 2 (diabetes mellitus, type 2)     insulin requiring.   . Coronary artery disease   . Hypertension   . Peripheral vascular disease   . ESRD (end stage renal disease) on dialysis     MWF dialysis schedule  . PUD (peptic ulcer disease) jan 2014    small antral ulcer and gastric erosions on EGD per Dr  Coletta Memos in [place].  . Hepatitis C 2007  . GIB (gastrointestinal bleeding) jan 2014    from gastric ulcer  . Anemia in chronic kidney disease   . Esophageal varices 03/2008    on EGD at non [place], found grade 1 esophageal varices.     Past Surgical History  Procedure Laterality Date  . Coronary artery bypass graft  ~ 2007 per pt recall    done in [place].   . Dialysis fistula creation Left 06/2006    left forarm Cimino arteriovenous fistula..  Dr Arbie Cookey  . Thrombectomy and revision of arterioventous (av) goretex  graft  02/2009    Dr Arbie Cookey  . Left forearm av graft.  11/2006    Dr Evelina Dun  . Colonoscopy w/ polypectomy  01/2007    dr Ronn Melena at [facility], rectal mass (path benign) and rectal polyp of undetermined pathology.   . Esophagogastroduodenoscopy  11/20091/2014     2009 finding of grade 1 esophageal varices. 2014 for CG emesis.  Dr Braulio Conte in [place]. found antral ulcer and gastic erosions.     Prior to Admission medications   Medication Sig Start Date End Date Taking? Authorizing Provider  amLODipine (NORVASC) 10 MG tablet Take 10 mg by mouth daily.   Yes Historical Provider, MD  aspirin EC 81 MG  tablet Take 81 mg by mouth daily.   Yes Historical Provider, MD  Calcium Acetate 667 MG TABS Take 2,001 mg by mouth 3 (three) times daily with meals.   Yes Historical Provider, MD  carvedilol (COREG) 25 MG tablet Take 25 mg by mouth 2 (two) times daily with a meal.   Yes Historical Provider, MD  cloNIDine (CATAPRES) 0.1 MG tablet Take 0.1 mg by mouth at bedtime.   Yes Historical Provider, MD  insulin aspart (NOVOLOG FLEXPEN) 100 UNIT/ML SOPN FlexPen Inject 20 Units into the skin 2 (two) times daily.   Yes Historical Provider, MD  Insulin Detemir (LEVEMIR FLEXPEN) 100 UNIT/ML SOPN Inject 25 Units into the skin daily.   Yes Historical Provider, MD  losartan (COZAAR) 100 MG tablet Take 100 mg by mouth daily.   Yes Historical Provider, MD  meloxicam (MOBIC) 15 MG  tablet Take 15 mg once every week or so.   Yes Historical Provider, MD  omeprazole (PRILOSEC) 20 MG capsule Take 20 mg by mouth daily.   Yes Historical Provider, MD  traZODone (DESYREL) 50 MG tablet Take 25 mg by mouth at bedtime.   Yes Historical Provider, MD  Nabumetone        500 mg       Once daily  Scheduled Meds: . [START ON 03/29/2013] amLODipine  10 mg Oral Daily  . [START ON 03/29/2013] carvedilol  25 mg Oral BID WC  . darbepoetin  40 mcg Intravenous Q Fri-HD  . insulin aspart  0-9 Units Subcutaneous TID WC  . [START ON 03/29/2013] losartan  100 mg Oral Daily  . octreotide  50 mcg Intravenous Once  . pantoprazole (PROTONIX) IV  40 mg Intravenous Q12H   Infusions: . octreotide (SANDOSTATIN) infusion     PRN Meds:    Allergies as of 03/28/2013  . (No Known Allergies)    History reviewed. No pertinent family history.  History   Social History  . Marital Status: Married    Spouse Name: N/A    Number of Children: N/A  . Years of Education: N/A   Occupational History  . Not on file.   Social History Main Topics  . Smoking status: Former Games developer  . Smokeless tobacco: Not on file  . Alcohol Use: No  . Drug Use: No  . Sexual Activity: Yes   Other Topics Concern  . Not on file   Social History Narrative  . No narrative on file    REVIEW OF SYSTEMS: Stable weight.  No falls No extremity swelling.  No PND No cough or dyspnea No angina No numbness in feet.  No non-healing sores No teeth, has full set of dentures No itching, no rash No ETOH for at least 10 years. Hep C never treated.  Flu shot given in last 6 weeks at HD center.  Sugars run from 60s to 325, quite labile.   PHYSICAL EXAM: Vital signs in last 24 hours: Filed Vitals:   03/28/13 1345  BP:   Pulse: 73  Temp:   Resp: 20   Wt Readings from Last 3 Encounters:  03/28/13 66.7 kg (147 lb 0.8 oz)    General: small framed, non-obese AAM.  Comfortable.  Not ill looking Head:  No swelling, no  asymmetry  Eyes:  + conj pallor.  No icterus Ears:  Not HOH  Nose:  No discharge or congestion Mouth:  Clear and moist, dentures in place Neck:  No mass or JVD Lungs:  Clear, good BS.  No  dyspnea or cough Heart: RRR.  No MRG Abdomen:  Soft, active BS, no mass or HSM.  No bruits, no scars Rectal: deferred,  Melena reported by medical team.   Musc/Skeltl: no deformed or swollen joints Extremities:  No pedal edema, feet warm, 3 plus pedal pulses.  Thrill in fistula in left forearm Neurologic:  No deficits, oriented x 3.  No tremor or limb weakness Skin:  No rash or sores Tattoos:  none Nodes:  No cervical or inguinal adenopathy   Psych:  Pleasant, cooperative. Relaxed.   Intake/Output from previous day:   Intake/Output this shift:    LAB RESULTS: No results found for this basename: WBC, HGB, HCT, PLT,  in the last 72 hours BMET Lab Results  Component Value Date   NA 137 03/03/2009   NA 134* 11/26/2006   K 5.1 03/03/2009   K 3.5 11/26/2006   GLUCOSE 134* 03/03/2009   GLUCOSE 161* 11/26/2006   LFT t bili 0.6  AST 68  ALT43  Alk Phos 262 in [place] 03/28/13 Albumin 2.6  PT/INR in [place] 03/28/13 13.6/1.3   RADIOLOGY STUDIES: No results found.   ENDOSCOPIC STUDIES: EGDs x 2 in [place], Dr Braulio Conte Jan 2014.  "small antral ulcer, gastric erosion" per renal records.   03/2008  EGD Grade 1 esophageal varices.   01/2007  Colonoscopy by Dr Ronn Melena at [facility].  One rectal polyp.  Non-cancerous rectal mass.  "negativ pathology"  But not clear if there was any adenomatous change.   IMPRESSION:   *  UGI bleed.  Rule out ulcer in pt with hx of same, H Pylori status not known, also could be NSAID induced ulcer.  Rule out esophageal variceal or portal hypertensive gastropathy bleed. Had small esoph varices on EGD in 2009 Pt currently stable and actually hypertensive.  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 No drug  use ever and ETOH abstinent for over 10 years.   *  ABL anemia, hx anemia of chronic disease.  Will be transfused with 2 PRBCs once blood matched.   *  ESRD, dialysis MWF.  Can go to HD today.  No overt signs of volume overload at present.   *  Type 2 IDDM, sugars labile  *  Hx non-cancerous rectal mass and rectal polyp of undetermined pathology in 2009.      PLAN:     *  Needs EGD, per d/w Dr Christella Hartigan this will be at 0830 tomorrow.  In meantime will switch to BID Protonix and start octreotide drip. Ok to dialyze today at which time at least one of two PRBCs cna be given.      Jennye Moccasin  03/28/2013, 2:59 PM Pager: [phone]     ________________________________________________________________________  [facility] MD note:  I personally examined the patient while he was in HD this afternoon, reviewed the data and agree with the assessment and plan described above.  Probable UGI bleed. H/o ulcers, but with known cirrhosis will put him on octreotide gtts to be safe. Planning on EGD tomorrow AM.   Rob Bunting, MD [facility] Pager [phone]

## 2013-03-28 NOTE — Procedures (Signed)
I was present at this dialysis session, have reviewed the session itself and made  appropriate changes   Vinson Moselle MD  pager [phone]    cell [phone]  03/28/2013, 4:25 PM

## 2013-03-28 NOTE — Progress Notes (Signed)
Nurse called report to HD nurse.  Nurse informed HD nurse that 2 units of packed red blood cells  are ready in lab for administration during HD.  Pt was alert and oriented prior to transport with wife at bedside.  Vitals prior to transport are documented in epic.

## 2013-03-29 ENCOUNTER — Encounter (HOSPITAL_COMMUNITY)
Admission: AD | Disposition: A | Discharge status: Home / Self Care | Payer: Self-pay | Source: Other Acute Inpatient Hospital | Attending: Internal Medicine

## 2013-03-29 ENCOUNTER — Encounter (HOSPITAL_COMMUNITY): Payer: Self-pay | Admitting: *Deleted

## 2013-03-29 DIAGNOSIS — I8510 Secondary esophageal varices without bleeding: Secondary | ICD-10-CM

## 2013-03-29 DIAGNOSIS — K7460 Unspecified cirrhosis of liver: Secondary | ICD-10-CM

## 2013-03-29 DIAGNOSIS — K3189 Other diseases of stomach and duodenum: Secondary | ICD-10-CM

## 2013-03-29 HISTORY — PX: ESOPHAGOGASTRODUODENOSCOPY: SHX5428

## 2013-03-29 LAB — CBC
HCT: 24.2 % — ABNORMAL LOW (ref 39.0–52.0)
HCT: 25.4 % — ABNORMAL LOW (ref 39.0–52.0)
Hemoglobin: 8.9 g/dL — ABNORMAL LOW (ref 13.0–17.0)
MCHC: 36.8 g/dL — ABNORMAL HIGH (ref 30.0–36.0)
MCV: 89.3 fL (ref 78.0–100.0)
Platelets: 52 10*3/uL — ABNORMAL LOW (ref 150–400)
RBC: 2.75 MIL/uL — ABNORMAL LOW (ref 4.22–5.81)
RDW: 18.5 % — ABNORMAL HIGH (ref 11.5–15.5)
WBC: 2.8 10*3/uL — ABNORMAL LOW (ref 4.0–10.5)

## 2013-03-29 LAB — GLUCOSE, CAPILLARY
Glucose-Capillary: 180 mg/dL — ABNORMAL HIGH (ref 70–99)
Glucose-Capillary: 243 mg/dL — ABNORMAL HIGH (ref 70–99)
Glucose-Capillary: 340 mg/dL — ABNORMAL HIGH (ref 70–99)

## 2013-03-29 LAB — TYPE AND SCREEN
ABO/RH(D): B POS
Antibody Screen: NEGATIVE
Unit division: 0

## 2013-03-29 LAB — RENAL FUNCTION PANEL
BUN: 23 mg/dL (ref 6–23)
CO2: 25 mEq/L (ref 19–32)
Chloride: 94 mEq/L — ABNORMAL LOW (ref 96–112)
Creatinine, Ser: 4.49 mg/dL — ABNORMAL HIGH (ref 0.50–1.35)

## 2013-03-29 SURGERY — EGD (ESOPHAGOGASTRODUODENOSCOPY)
Anesthesia: Moderate Sedation

## 2013-03-29 MED ORDER — RENA-VITE PO TABS
1.0000 | ORAL_TABLET | Freq: Every day | ORAL | Status: DC
  Administered 2013-03-29 – 2013-03-30 (×2): 1 via ORAL
  Filled 2013-03-29 (×3): qty 1

## 2013-03-29 MED ORDER — FENTANYL CITRATE 0.05 MG/ML IJ SOLN
INTRAMUSCULAR | Status: DC | PRN
  Administered 2013-03-29 (×2): 25 ug via INTRAVENOUS

## 2013-03-29 MED ORDER — PANTOPRAZOLE SODIUM 40 MG PO TBEC
40.0000 mg | DELAYED_RELEASE_TABLET | Freq: Two times a day (BID) | ORAL | Status: DC
  Administered 2013-03-29 – 2013-03-31 (×5): 40 mg via ORAL
  Filled 2013-03-29 (×7): qty 1

## 2013-03-29 MED ORDER — BUTAMBEN-TETRACAINE-BENZOCAINE 2-2-14 % EX AERO
INHALATION_SPRAY | CUTANEOUS | Status: DC | PRN
  Administered 2013-03-29: 2 via TOPICAL

## 2013-03-29 MED ORDER — GI COCKTAIL ~~LOC~~
30.0000 mL | Freq: Two times a day (BID) | ORAL | Status: DC | PRN
  Administered 2013-03-29: 30 mL via ORAL
  Filled 2013-03-29 (×2): qty 30

## 2013-03-29 MED ORDER — SODIUM CHLORIDE 0.9 % IV SOLN
INTRAVENOUS | Status: DC
  Administered 2013-03-29: 20 mL/h via INTRAVENOUS

## 2013-03-29 MED ORDER — FENTANYL CITRATE 0.05 MG/ML IJ SOLN
INTRAMUSCULAR | Status: AC
  Filled 2013-03-29: qty 2

## 2013-03-29 MED ORDER — MIDAZOLAM HCL 10 MG/2ML IJ SOLN
INTRAMUSCULAR | Status: DC | PRN
  Administered 2013-03-29 (×2): 2 mg via INTRAVENOUS
  Administered 2013-03-29 (×2): 1 mg via INTRAVENOUS

## 2013-03-29 MED ORDER — MIDAZOLAM HCL 5 MG/ML IJ SOLN
INTRAMUSCULAR | Status: AC
  Filled 2013-03-29: qty 2

## 2013-03-29 NOTE — Progress Notes (Signed)
  [facility] Progress Note  Subjective:   Resting in bed. Mild epigastric pain/burning s/p EGD this a.m.   Objective Filed Vitals:   03/29/13 1030 03/29/13 1035 03/29/13 1055 03/29/13 1217  BP: 133/49 137/50 131/46 169/68  Pulse: 70 71 70 64  Temp:    98.4 F (36.9 C)  TempSrc:    Oral  Resp: 24 16 16 13   Height:      Weight:      SpO2: 95% 96% 94% 100%   Physical Exam General: Sleepy s/p procedure, cooperative, NAD Heart: RRR Lungs: CTA bilat, no wheezes or rhonchi Abdomen: Soft, slightly distended, tender epigastric region, +BS Extremities: SCDs present, no LE edema Dialysis Access: L AVF + bruit  Dialysis orders: MWF [facility]  3:15 hrs F180 68kg 2K/2.5Ca Profile 2 Heparin none  Hectorol none Epo 2000 Venofer none  Last labs: tsat 44% Ferritin 1860 phos 2-4 range pth 213  Assessment/Plan: 1. Hematemesis / GI bleed -  EGD this am with band ligation of varices x 7. To be observed for bleeding, then home tomorrow per GI if no issues.  2. ESRD - MWF, K + 3.7. Next HD Monday 3. Anemia d/t CKD / ABL -  Hgb 8.9 > 6.4 s/p 2 units PRBCs. Cont esa w darbe 40/wk, iron stores ok as op. Follow CBC 4. MBD (metabolic bone disease) - Ca 7.7 (9 corrected) P 3.1. Cont phoslo w meals. PTH in range. No op Vit D. 5. HTN/volume - SBPs 160s. Home meds resumed (norvasc, losartan, coreg and clonidine). No chg to edw 6. DM on insulin 7. Hx cirrhosis  Scot Jun. Thad Ranger [facility] Pager [phone] 03/29/2013,12:39 PM  LOS: 1 day   I have seen and examined patient, discussed with PA and agree with assessment and plan as outlined above. Vinson Moselle MD pager [phone]    cell [phone] 03/29/2013, 4:15 PM     Additional Objective Labs: Basic Metabolic Panel:  Recent Labs Lab 03/29/13 0135  NA 132*  K 3.7  CL 94*  CO2 25  GLUCOSE 220*  BUN 23  CREATININE 4.49*  CALCIUM 7.7*  PHOS 3.1   Liver Function Tests:  Recent Labs Lab 03/29/13 0135   ALBUMIN 2.3*   CBC:  Recent Labs Lab 03/28/13 1600 03/29/13 0135  WBC 3.9* 3.4*  HGB 6.4* 8.9*  HCT 18.0* 24.2*  MCV 97.8 89.3  PLT 54* 52*   Cardiac Enzymes:  Recent Labs Lab 03/28/13 1505 03/28/13 1600 03/29/13 0135  TROPONINI <0.30 <0.30 <0.30   CBG:  Recent Labs Lab 03/28/13 1644 03/28/13 2221 03/29/13 0817  GLUCAP 139* 170* 272*   IStudies/Results: No results found.  Medications: . sodium chloride 20 mL/hr (03/29/13 0147)   . amLODipine  10 mg Oral Daily  . carvedilol  25 mg Oral BID WC  . darbepoetin  40 mcg Intravenous Q Fri-HD  . insulin aspart  0-9 Units Subcutaneous TID WC  . losartan  100 mg Oral Daily  . multivitamin  1 tablet Oral QHS  . pantoprazole  40 mg Oral BID AC

## 2013-03-29 NOTE — Progress Notes (Signed)
[facility] PROGRESS NOTE      [name] JXB:[id number] DOB: 1950/07/03 DOA: 03/28/2013 PCP: Anselmo Pickler, MD  Assessment/Plan: GI bleed - gastroenterology has been consulted and patient underwent an EGD this morning, without clear signs of recent bleeding. Patient does have esophageal varices now status post banding. He also has changes of moderate portal gastropathy. We'll continue to monitor CBCs for the next 24 hours, and if stable, patient will be discharged tomorrow. - Advance diet Anemia due to #1 - status post transfusion of 2 units of packed red blood cells End-stage renal disease - nephrology has been consulted.  - He underwent hemodialysis last night. Type 2 diabetes - start sliding scale, keep patient n.p.o. Closely monitor. He is on Levemir 40 units daily and NovoLog 20 units twice daily. Will restart if needed.  Hypertension - restart his home blood pressure medications.  HCV  Liver cirrhosis - patient denies history of encephalopathy or ascites   Diet: clears, advance as tolerated Fluids: none DVT Prophylaxis: SCDs  Code Status: Full Family Communication: wife bedside  Disposition Plan: home tomorrow if HH stable.   Consultants:  GI  Procedures:  EGD   Antibiotics  Anti-infectives   None     Antibiotics Given (last 72 hours)   None      HPI/Subjective: - bit drowsy after the procedure  Objective: Filed Vitals:   03/29/13 1025 03/29/13 1030 03/29/13 1035 03/29/13 1055  BP: 149/61 133/49 137/50 131/46  Pulse: 70 70 71 70  Temp:      TempSrc:      Resp: 24 24 16 16   Height:      Weight:      SpO2: 96% 95% 96% 94%    Intake/Output Summary (Last 24 hours) at 03/29/13 1146 Last data filed at 03/29/13 0200  Gross per 24 hour  Intake   1250 ml  Output    110 ml  Net   1140 ml   Filed Weights   03/28/13 1620 03/28/13 2011 03/29/13 0429  Weight: 67.1 kg (147 lb 14.9 oz) 67.7 kg (149 lb 4 oz) 68.1 kg (150 lb 2.1 oz)     Exam:   General:  NAD  Cardiovascular: regular rate and rhythm, without MRG  Respiratory: good air movement, clear to auscultation throughout, no wheezing, ronchi or rales  Abdomen: soft, not tender to palpation, positive bowel sounds  MSK: no peripheral edema  Neuro: non focal  Data Reviewed: Basic Metabolic Panel:  Recent Labs Lab 03/29/13 0135  NA 132*  K 3.7  CL 94*  CO2 25  GLUCOSE 220*  BUN 23  CREATININE 4.49*  CALCIUM 7.7*  PHOS 3.1   Liver Function Tests:  Recent Labs Lab 03/29/13 0135  ALBUMIN 2.3*   No results found for this basename: LIPASE, AMYLASE,  in the last 168 hours No results found for this basename: AMMONIA,  in the last 168 hours CBC:  Recent Labs Lab 03/28/13 1600 03/29/13 0135  WBC 3.9* 3.4*  HGB 6.4* 8.9*  HCT 18.0* 24.2*  MCV 97.8 89.3  PLT 54* 52*   Cardiac Enzymes:  Recent Labs Lab 03/28/13 1505 03/28/13 1600 03/29/13 0135  TROPONINI <0.30 <0.30 <0.30   BNP (last 3 results) No results found for this basename: PROBNP,  in the last 8760 hours CBG:  Recent Labs Lab 03/28/13 1644 03/28/13 2221 03/29/13 0817  GLUCAP 139* 170* 272*    Recent Results (from the past 240 hour(s))  MRSA PCR SCREENING  Status: None   Collection Time    03/28/13 11:55 AM      Result Value Range Status   MRSA by PCR NEGATIVE  NEGATIVE Final   Comment:            The GeneXpert MRSA Assay (FDA     approved for NASAL specimens     only), is one component of a     comprehensive MRSA colonization     surveillance program. It is not     intended to diagnose MRSA     infection nor to guide or     monitor treatment for     MRSA infections.     Studies: No results found.  Scheduled Meds: . amLODipine  10 mg Oral Daily  . carvedilol  25 mg Oral BID WC  . darbepoetin  40 mcg Intravenous Q Fri-HD  . insulin aspart  0-9 Units Subcutaneous TID WC  . losartan  100 mg Oral Daily  . pantoprazole  40 mg Oral BID AC    Continuous Infusions: . sodium chloride 20 mL/hr (03/29/13 0147)    Active Problems:   ESRD on dialysis   Cirrhosis of liver   GI bleed   Diabetes mellitus, type 2   HTN (hypertension)   HCV infection   Esophageal varices in cirrhosis   Portal hypertensive gastropathy  Time spent: 25  Pamella Pert, MD [facility] Pager [phone]. If 7 PM - 7 AM, please contact night-coverage at [email or web address], password [facility] 03/29/2013, 11:46 AM  LOS: 1 day

## 2013-03-29 NOTE — H&P (View-Only) (Signed)
[facility] Consult: 2:59 PM 03/28/2013  LOS: 0 days    Referring Provider: Dr Gherge Primary Care Physician:  ACHREJA, MANJEET KAUR, MD Primary Gastroenterologist:  [facility]:  Dr Paul Hiyashi and Dr Meisenheimer in [place].    Reason for Consultation:  CG emesis and melenic stool.   HPI: [name] is a 62 y.o. male.  ESRD, dialysis on MWF, dialyzes in [place].  ETOH and Hep C + cirrhosis.  Followed at [facility].  Hx of gastric ulcers, on 20 mg Omeprazole daily.   Hand written notes from dialysis state a 03/2008 EGD at [facility] showed grade 1 esophageal ulcers.  Had bleeding ulcer (melenic stool, no vomiting then) in Jan 2014.  Had EGD with small antral ulcer and gastric erosions.  Pt says he had follow up EGD with Dr Meisenheimer.  No mention of portal htn or varices at that time in renal notes.   Went to [place] ED with hx CG emesis.  Cg emesis and dark, soft stools several times from Wednesday at 5 pm to wee hours of AM Thursday. Nausea and some more dark stools but no emesis on Thursday.  Went to [place] ED 11/20 where Hgb 6.6.  Had been 9.2 on 11/19 at HD center.  Transferred due to need for HD to [place].  To be transfused with 2 units PRBCs  Only transfusions occurred peri-op phase post CABG around 2007.  Did get iron infusions at HD in past, but these stopped at least one year ago. On TID epogen at dialysis.  No GI problems before Wednesday.  Compliant with Omeprazole.  Takes Relafen daily and Meloxicam about once a week and for left shoulder pain. Also on low dose ASA.   Past Medical History  Diagnosis Date  . DM type 2 (diabetes mellitus, type 2)     insulin requiring.   . Coronary artery disease   . Hypertension   . Peripheral vascular disease   . ESRD (end stage renal disease) on dialysis     MWF dialysis schedule  . PUD (peptic ulcer disease) jan 2014    small antral ulcer and gastric erosions on EGD per Dr  Robert Meisenheimer in .  . Hepatitis C 2007  . GIB (gastrointestinal bleeding) jan 2014    from gastric ulcer  . Anemia in chronic kidney disease   . Esophageal varices 03/2008    on EGD at non [place], found grade 1 esophageal varices.     Past Surgical History  Procedure Laterality Date  . Coronary artery bypass graft  ~ 2007 per pt recall    done in [place].   . Dialysis fistula creation Left 06/2006    left forarm Cimino arteriovenous fistula..  Dr Early  . Thrombectomy and revision of arterioventous (av) goretex  graft  02/2009    Dr Early  . Left forearm av graft.  11/2006    Dr Field's  . Colonoscopy w/ polypectomy  01/2007    dr tillman at [facility], rectal mass (path benign) and rectal polyp of undetermined pathology.   . Esophagogastroduodenoscopy  11/20091/2014     2009 finding of grade 1 esophageal varices. 2014 for CG emesis.  Dr Meisenheimer in [place]. found antral ulcer and gastic erosions.     Prior to Admission medications   Medication Sig Start Date End Date Taking? Authorizing Provider  amLODipine (NORVASC) 10 MG tablet Take 10 mg by mouth daily.   Yes Historical Provider, MD  aspirin EC 81 MG   tablet Take 81 mg by mouth daily.   Yes Historical Provider, MD  Calcium Acetate 667 MG TABS Take 2,001 mg by mouth 3 (three) times daily with meals.   Yes Historical Provider, MD  carvedilol (COREG) 25 MG tablet Take 25 mg by mouth 2 (two) times daily with a meal.   Yes Historical Provider, MD  cloNIDine (CATAPRES) 0.1 MG tablet Take 0.1 mg by mouth at bedtime.   Yes Historical Provider, MD  insulin aspart (NOVOLOG FLEXPEN) 100 UNIT/ML SOPN FlexPen Inject 20 Units into the skin 2 (two) times daily.   Yes Historical Provider, MD  Insulin Detemir (LEVEMIR FLEXPEN) 100 UNIT/ML SOPN Inject 25 Units into the skin daily.   Yes Historical Provider, MD  losartan (COZAAR) 100 MG tablet Take 100 mg by mouth daily.   Yes Historical Provider, MD  meloxicam (MOBIC) 15 MG  tablet Take 15 mg once every week or so.   Yes Historical Provider, MD  omeprazole (PRILOSEC) 20 MG capsule Take 20 mg by mouth daily.   Yes Historical Provider, MD  traZODone (DESYREL) 50 MG tablet Take 25 mg by mouth at bedtime.   Yes Historical Provider, MD  Nabumetone        500 mg       Once daily  Scheduled Meds: . [START ON 03/29/2013] amLODipine  10 mg Oral Daily  . [START ON 03/29/2013] carvedilol  25 mg Oral BID WC  . darbepoetin  40 mcg Intravenous Q Fri-HD  . insulin aspart  0-9 Units Subcutaneous TID WC  . [START ON 03/29/2013] losartan  100 mg Oral Daily  . octreotide  50 mcg Intravenous Once  . pantoprazole (PROTONIX) IV  40 mg Intravenous Q12H   Infusions: . octreotide (SANDOSTATIN) infusion     PRN Meds:    Allergies as of 03/28/2013  . (No Known Allergies)    History reviewed. No pertinent family history.  History   Social History  . Marital Status: Married    Spouse Name: N/A    Number of Children: N/A  . Years of Education: N/A   Occupational History  . Not on file.   Social History Main Topics  . Smoking status: Former Smoker  . Smokeless tobacco: Not on file  . Alcohol Use: No  . Drug Use: No  . Sexual Activity: Yes   Other Topics Concern  . Not on file   Social History Narrative  . No narrative on file    REVIEW OF SYSTEMS: Stable weight.  No falls No extremity swelling.  No PND No cough or dyspnea No angina No numbness in feet.  No non-healing sores No teeth, has full set of dentures No itching, no rash No ETOH for at least 10 years. Hep C never treated.  Flu shot given in last 6 weeks at HD center.  Sugars run from 60s to 325, quite labile.   PHYSICAL EXAM: Vital signs in last 24 hours: Filed Vitals:   03/28/13 1345  BP:   Pulse: 73  Temp:   Resp: 20   Wt Readings from Last 3 Encounters:  03/28/13 66.7 kg (147 lb 0.8 oz)    General: small framed, non-obese AAM.  Comfortable.  Not ill looking Head:  No swelling, no  asymmetry  Eyes:  + conj pallor.  No icterus Ears:  Not HOH  Nose:  No discharge or congestion Mouth:  Clear and moist, dentures in place Neck:  No mass or JVD Lungs:  Clear, good BS.  No   dyspnea or cough Heart: RRR.  No MRG Abdomen:  Soft, active BS, no mass or HSM.  No bruits, no scars Rectal: deferred,  Melena reported by medical team.   Musc/Skeltl: no deformed or swollen joints Extremities:  No pedal edema, feet warm, 3 plus pedal pulses.  Thrill in fistula in left forearm Neurologic:  No deficits, oriented x 3.  No tremor or limb weakness Skin:  No rash or sores Tattoos:  none Nodes:  No cervical or inguinal adenopathy   Psych:  Pleasant, cooperative. Relaxed.   Intake/Output from previous day:   Intake/Output this shift:    LAB RESULTS: No results found for this basename: WBC, HGB, HCT, PLT,  in the last 72 hours BMET Lab Results  Component Value Date   NA 137 03/03/2009   NA 134* 11/26/2006   K 5.1 03/03/2009   K 3.5 11/26/2006   GLUCOSE 134* 03/03/2009   GLUCOSE 161* 11/26/2006   LFT t bili 0.6  AST 68  ALT43  Alk Phos 262 in [place] 03/28/13 Albumin 2.6  PT/INR in [facility] 03/28/13 13.6/1.3   RADIOLOGY STUDIES: No results found.   ENDOSCOPIC STUDIES: EGDs x 2 in [place], Dr Meisenheimer Jan 2014.  "small antral ulcer, gastric erosion" per renal records.   03/2008  EGD Grade 1 esophageal varices.   01/2007  Colonoscopy by Dr Tillman at [facility].  One rectal polyp.  Non-cancerous rectal mass.  "negativ pathology"  But not clear if there was any adenomatous change.   IMPRESSION:   *  UGI bleed.  Rule out ulcer in pt with hx of same, H Pylori status not known, also could be NSAID induced ulcer.  Rule out esophageal variceal or portal hypertensive gastropathy bleed. Had small esoph varices on EGD in 2009 Pt currently stable and actually hypertensive.  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 No drug  use ever and ETOH abstinent for over 10 years.   *  ABL anemia, hx anemia of chronic disease.  Will be transfused with 2 PRBCs once blood matched.   *  ESRD, dialysis MWF.  Can go to HD today.  No overt signs of volume overload at present.   *  Type 2 IDDM, sugars labile  *  Hx non-cancerous rectal mass and rectal polyp of undetermined pathology in 2009.      PLAN:     *  Needs EGD, per d/w Dr Jacobs this will be at 0830 tomorrow.  In meantime will switch to BID Protonix and start octreotide drip. Ok to dialyze today at which time at least one of two PRBCs cna be given.      Sarah Gribbin  03/28/2013, 2:59 PM Pager: [phone]     ________________________________________________________________________  [facility] MD note:  I personally examined the patient while he was in HD this afternoon, reviewed the data and agree with the assessment and plan described above.  Probable UGI bleed. H/o ulcers, but with known cirrhosis will put him on octreotide gtts to be safe. Planning on EGD tomorrow AM.   Daniel Jacobs, MD [facility] Pager [phone]  

## 2013-03-29 NOTE — Op Note (Signed)
[facility] [address] [place], [address]   ENDOSCOPY PROCEDURE REPORT  PATIENT: [name], [name]  MR#: [id number] BIRTHDATE: October 28, 1950 , 62  yrs. old GENDER: Male ENDOSCOPIST: Rachael Fee, MD REFERRED BY:  [facility] PROCEDURE DATE:  03/29/2013 PROCEDURE:  EGD w/ band ligation of varices ASA CLASS:     Class IV INDICATIONS:  melena, CGE, acute on chronic anemia; known cirrhosis (sees Dr.  Brunilda Payor), has had EGDs with Dr.  Dan Europe (2009, 2012) with mention of ulcer disease in past. MEDICATIONS: Fentanyl 50 mcg IV and Versed 6 mg IV TOPICAL ANESTHETIC: Cetacaine Spray  DESCRIPTION OF PROCEDURE: After the risks benefits and alternatives of the procedure were thoroughly explained, informed consent was obtained.  The Pentax Gastroscope [id number] endoscope was introduced through the mouth and advanced to the second portion of the duodenum. Without limitations.  The instrument was slowly withdrawn as the mucosa was fully examined.   There were four trunks of medium to large distal esophagus varices. None had clear sign of recent bleeding.  There were changes of moderate portal gastropathy throughout his stomach.  There were no gastric varices.  There was no blood in UGI tract.  I elected to band ligate the varices, placed 7 ligating bands in good position. The examination was otherwise normal.  Retroflexed views revealed no abnormalities.     The scope was then withdrawn from the patient and the procedure completed. COMPLICATIONS: There were no complications. ENDOSCOPIC IMPRESSION: There were four trunks of medium to large distal esophagus varices. None had clear sign of recent bleeding.  There were changes of moderate portal gastropathy throughout his stomach.  There were no gastric varices.  There was no blood in UGI tract.  I elected to band ligate the varices, placed 7 ligating bands in good position. The examination was otherwise  normal.  RECOMMENDATIONS: Bleeding has clearly stopped.  OK to stop the ocreotide gtts. Should continue PPI twice daily for at least another month.  He should be observed for recurrent bleeding another 24 hours and if none, then he is OK to go home tomorrow.  He will need to follow up with his primary gastroenterologist, Dr.  Radene Ou within then next 3-4 weeks.  He should have repeat variceal banding in 5-6 weeks.   eSigned:  Rachael Fee, MD 03/29/2013 10:26 AM   CC: Derry Skill, MD: Dennie Bible.

## 2013-03-29 NOTE — Interval H&P Note (Signed)
History and Physical Interval Note:  03/29/2013 9:46 AM  [name]  has presented today for surgery, with the diagnosis of gi bleed  The various methods of treatment have been discussed with the patient and family. After consideration of risks, benefits and other options for treatment, the patient has consented to  Procedure(s): ESOPHAGOGASTRODUODENOSCOPY (EGD) (N/A) as a surgical intervention .  The patient's history has been reviewed, patient examined, no change in status, stable for surgery.  I have reviewed the patient's chart and labs.  Questions were answered to the patient's satisfaction.     Rachael Fee

## 2013-03-30 LAB — GLUCOSE, CAPILLARY
Glucose-Capillary: 413 mg/dL — ABNORMAL HIGH (ref 70–99)
Glucose-Capillary: 220 mg/dL — ABNORMAL HIGH (ref 70–99)
Glucose-Capillary: 127 mg/dL — ABNORMAL HIGH (ref 70–99)

## 2013-03-30 LAB — CBC
HCT: 27.4 % — ABNORMAL LOW (ref 39.0–52.0)
Hemoglobin: 9.4 g/dL — ABNORMAL LOW (ref 13.0–17.0)
MCH: 32.1 pg (ref 26.0–34.0)
MCV: 93.5 fL (ref 78.0–100.0)
RBC: 2.93 MIL/uL — ABNORMAL LOW (ref 4.22–5.81)
RDW: 18.5 % — ABNORMAL HIGH (ref 11.5–15.5)

## 2013-03-30 MED ORDER — INSULIN DETEMIR 100 UNIT/ML ~~LOC~~ SOLN
25.0000 [IU] | Freq: Every day | SUBCUTANEOUS | Status: DC
  Administered 2013-03-30: 25 [IU] via SUBCUTANEOUS
  Filled 2013-03-30: qty 0.25

## 2013-03-30 MED ORDER — DEXTROSE 50 % IV SOLN: 50.0000 mL | Freq: Once | INTRAVENOUS | Status: AC | PRN

## 2013-03-30 MED ORDER — DEXTROSE 50 % IV SOLN
INTRAVENOUS | Status: AC
  Administered 2013-03-30: 50 mL
  Filled 2013-03-30: qty 50

## 2013-03-30 MED ORDER — INSULIN ASPART 100 UNIT/ML ~~LOC~~ SOLN
10.0000 [IU] | Freq: Once | SUBCUTANEOUS | Status: AC
  Administered 2013-03-30: 10 [IU] via SUBCUTANEOUS

## 2013-03-30 MED ORDER — HYDROMORPHONE HCL PF 1 MG/ML IJ SOLN: 1.0000 mg | INTRAMUSCULAR | Status: DC | PRN

## 2013-03-30 MED ORDER — HYDROMORPHONE HCL PF 1 MG/ML IJ SOLN
1.0000 mg | Freq: Once | INTRAMUSCULAR | Status: AC
  Administered 2013-03-30: 1 mg via INTRAVENOUS
  Filled 2013-03-30: qty 1

## 2013-03-30 MED ORDER — INSULIN DETEMIR 100 UNIT/ML ~~LOC~~ SOLN
15.0000 [IU] | Freq: Every day | SUBCUTANEOUS | Status: DC
  Administered 2013-03-31: 15 [IU] via SUBCUTANEOUS
  Filled 2013-03-30: qty 0.15

## 2013-03-30 MED ORDER — ONDANSETRON HCL 4 MG/2ML IJ SOLN
4.0000 mg | Freq: Four times a day (QID) | INTRAMUSCULAR | Status: DC | PRN
  Administered 2013-03-30: 4 mg via INTRAVENOUS
  Filled 2013-03-30: qty 2

## 2013-03-30 NOTE — Progress Notes (Signed)
[facility] Progress Note  Subjective:   Fetal position. Nausea but no vomiting. Substernal/epigastric pain. Has had a BM. Requesting full liquid diet over soft.  Objective Filed Vitals:   03/30/13 0001 03/30/13 0536 03/30/13 0820 03/30/13 1251  BP: 152/60  121/50 131/75  Pulse: 63 69 65 73  Temp: 98.5 F (36.9 C) 98.8 F (37.1 C) 98.3 F (36.8 C) 98.3 F (36.8 C)  TempSrc: Axillary Oral Oral Oral  Resp: 16 12 14 14   Height:      Weight:      SpO2: 99% 90% 94% 96%   Physical Exam General:  Cooperative, looks uncomfortable. Heart: RRR Lungs: CTA bilat, no wheezes or rhonchi Abdomen: Soft, trace distention, tender epigastric region, + BS Extremities: No LE edema Dialysis Access: L AVF + bruit  Dialysis orders: MWF [place]  3:15 hrs F180 68kg 2K/2.5Ca Profile 2 Heparin none  Hectorol none Epo 2000 Venofer none  Last labs: tsat 44% Ferritin 1860 phos 2-4 range pth 213  Assessment/Plan: 1. Hematemesis / GI bleed - EGD 11/22 with band ligation of varices x 7. Now with nausea, worsening pain, and inability to tolerate solids. Mgmt per primary, GI. 2. ESRD - MWF, K + 3.7. HD tomorrow if still admitted. No heparin. 3. Anemia d/t CKD / ABL - Hgb 9.4 > 8.9 > 6.4 s/p 2 units PRBCs. Cont esa w darbe 40/wk, iron stores ok as op. Follow CBC 4. MBD (metabolic bone disease) - Ca 7.7 (9 corrected) P 3.1. Phoslo w meals. PTH in range. No op Vit D. 5. HTN/volume - SBPs 130s.  Home meds resumed (norvasc, losartan, coreg and clonidine). No chg to edw 6. Nutrition - Full liquids per pt request. Resume renal diet as appropriate. 7. DM on insulin 8. Hx cirrhosis  Scot Jun. Thad Ranger [facility] Pager [phone] 03/30/2013,1:03 PM  LOS: 2 days   I have seen and examined patient, discussed with PA and agree with assessment and plan as outlined above. Vinson Moselle MD pager [phone]    cell [phone] 03/30/2013, 3:12 PM    Additional  Objective Labs: Basic Metabolic Panel:  Recent Labs Lab 03/29/13 0135 03/30/13 0855  NA 132*  --   K 3.7  --   CL 94*  --   CO2 25  --   GLUCOSE 220* 407*  BUN 23  --   CREATININE 4.49*  --   CALCIUM 7.7*  --   PHOS 3.1  --    Liver Function Tests:  Recent Labs Lab 03/29/13 0135  ALBUMIN 2.3*   CBC:  Recent Labs Lab 03/28/13 1600 03/29/13 0135 03/29/13 1927 03/30/13 0405  WBC 3.9* 3.4* 2.8* 3.8*  HGB 6.4* 8.9* 8.9* 9.4*  HCT 18.0* 24.2* 25.4* 27.4*  MCV 97.8 89.3 92.4 93.5  PLT 54* 52* 58* 59*     Cardiac Enzymes:  Recent Labs Lab 03/28/13 1505 03/28/13 1600 03/29/13 0135  TROPONINI <0.30 <0.30 <0.30   CBG:  Recent Labs Lab 03/29/13 0817 03/29/13 1211 03/29/13 1712 03/29/13 2145 03/30/13 0750  GLUCAP 272* 180* 243* 340* 413*    Studies/Results: No results found. Medications:   . amLODipine  10 mg Oral Daily  . carvedilol  25 mg Oral BID WC  . darbepoetin  40 mcg Intravenous Q Fri-HD  . insulin aspart  0-9 Units Subcutaneous TID WC  . insulin detemir  25 Units Subcutaneous Daily  . losartan  100 mg Oral Daily  . multivitamin  1 tablet Oral QHS  .  pantoprazole  40 mg Oral BID AC

## 2013-03-30 NOTE — Progress Notes (Signed)
[facility] PROGRESS NOTE      [name] RUE:[id number] DOB: October 11, 1950 DOA: 03/28/2013 PCP: Anselmo Pickler, MD  Assessment/Plan: GI bleed - gastroenterology has been consulted and patient underwent an EGD 11/22, without clear signs of recent bleeding. Patient was found to have esophageal varices now status post banding. He also has changes of moderate portal gastropathy. We'll continue to monitor CBCs for the next 24 hours, and if stable, patient will be discharged tomorrow. - Advance diet - tolerating diet well last night, however this morning with poor po intake, vomiting and unable to eat anything today. No blood in his emesis, no dark coffee ground material. ?some component of gastroparesis Anemia due to #1 - status post transfusion of 2 units of packed red blood cells End-stage renal disease - nephrology has been consulted.  - He underwent hemodialysis last night. Type 2 diabetes  - restarted his long acting and sliding scale.  Hypertension - restart his home blood pressure medications.  HCV  Liver cirrhosis - no history of encephalopathy or ascites  Stable from GI bleed standpoint, transfer to floor today.   Diet: clears, advance as tolerated Fluids: none DVT Prophylaxis: SCDs  Code Status: Full Family Communication: wife bedside  Disposition Plan: home tomorrow if HH stable and tolerates po better.   Consultants:  GI  Procedures:  EGD   Antibiotics  Anti-infectives   None     Antibiotics Given (last 72 hours)   None      HPI/Subjective: - bit drowsy after the procedure  Objective: Filed Vitals:   03/30/13 0001 03/30/13 0536 03/30/13 0820 03/30/13 1251  BP: 152/60  121/50 131/75  Pulse: 63 69 65 73  Temp: 98.5 F (36.9 C) 98.8 F (37.1 C) 98.3 F (36.8 C) 98.3 F (36.8 C)  TempSrc: Axillary Oral Oral Oral  Resp: 16 12 14 14   Height:      Weight:      SpO2: 99% 90% 94% 96%    Intake/Output Summary (Last 24 hours) at  03/30/13 1341 Last data filed at 03/30/13 0800  Gross per 24 hour  Intake    120 ml  Output      0 ml  Net    120 ml   Filed Weights   03/28/13 1620 03/28/13 2011 03/29/13 0429  Weight: 67.1 kg (147 lb 14.9 oz) 67.7 kg (149 lb 4 oz) 68.1 kg (150 lb 2.1 oz)    Exam:   General:  NAD  Cardiovascular: regular rate and rhythm, without MRG  Respiratory: good air movement, clear to auscultation throughout, no wheezing, ronchi or rales  Abdomen: soft, not tender to palpation, positive bowel sounds  MSK: no peripheral edema  Neuro: non focal  Data Reviewed: Basic Metabolic Panel:  Recent Labs Lab 03/29/13 0135 03/30/13 0855  NA 132*  --   K 3.7  --   CL 94*  --   CO2 25  --   GLUCOSE 220* 407*  BUN 23  --   CREATININE 4.49*  --   CALCIUM 7.7*  --   PHOS 3.1  --    Liver Function Tests:  Recent Labs Lab 03/29/13 0135  ALBUMIN 2.3*   No results found for this basename: LIPASE, AMYLASE,  in the last 168 hours No results found for this basename: AMMONIA,  in the last 168 hours CBC:  Recent Labs Lab 03/28/13 1600 03/29/13 0135 03/29/13 1927 03/30/13 0405  WBC 3.9* 3.4* 2.8* 3.8*  HGB 6.4* 8.9* 8.9* 9.4*  HCT 18.0* 24.2* 25.4* 27.4*  MCV 97.8 89.3 92.4 93.5  PLT 54* 52* 58* 59*   Cardiac Enzymes:  Recent Labs Lab 03/28/13 1505 03/28/13 1600 03/29/13 0135  TROPONINI <0.30 <0.30 <0.30   BNP (last 3 results) No results found for this basename: PROBNP,  in the last 8760 hours CBG:  Recent Labs Lab 03/29/13 1712 03/29/13 2145 03/30/13 0750 03/30/13 1134 03/30/13 1253  GLUCAP 243* 340* 413* 220* 146*    Recent Results (from the past 240 hour(s))  MRSA PCR SCREENING     Status: None   Collection Time    03/28/13 11:55 AM      Result Value Range Status   MRSA by PCR NEGATIVE  NEGATIVE Final   Comment:            The GeneXpert MRSA Assay (FDA     approved for NASAL specimens     only), is one component of a     comprehensive MRSA  colonization     surveillance program. It is not     intended to diagnose MRSA     infection nor to guide or     monitor treatment for     MRSA infections.     Studies: No results found.  Scheduled Meds: . amLODipine  10 mg Oral Daily  . carvedilol  25 mg Oral BID WC  . darbepoetin  40 mcg Intravenous Q Fri-HD  .  HYDROmorphone (DILAUDID) injection  1 mg Intravenous Once  . insulin aspart  0-9 Units Subcutaneous TID WC  . insulin detemir  25 Units Subcutaneous Daily  . losartan  100 mg Oral Daily  . multivitamin  1 tablet Oral QHS  . pantoprazole  40 mg Oral BID AC   Continuous Infusions:    Active Problems:   ESRD on dialysis   Cirrhosis of liver   GI bleed   Diabetes mellitus, type 2   HTN (hypertension)   HCV infection   Esophageal varices in cirrhosis   Portal hypertensive gastropathy  Time spent: 25  Pamella Pert, MD [facility] Pager [phone]. If 7 PM - 7 AM, please contact night-coverage at [email or web address], password [facility] 03/30/2013, 1:41 PM  LOS: 2 days

## 2013-03-31 ENCOUNTER — Encounter (HOSPITAL_COMMUNITY): Payer: Self-pay | Admitting: Gastroenterology

## 2013-03-31 LAB — RENAL FUNCTION PANEL
Albumin: 2.2 g/dL — ABNORMAL LOW (ref 3.5–5.2)
CO2: 26 mEq/L (ref 19–32)
Calcium: 7.9 mg/dL — ABNORMAL LOW (ref 8.4–10.5)
Creatinine, Ser: 9.97 mg/dL — ABNORMAL HIGH (ref 0.50–1.35)
GFR calc Af Amer: 6 mL/min — ABNORMAL LOW (ref >90)
GFR calc non Af Amer: 5 mL/min — ABNORMAL LOW (ref >90)
Glucose, Bld: 99 mg/dL (ref 70–99)
Phosphorus: 6.2 mg/dL — ABNORMAL HIGH (ref 2.3–4.6)
Sodium: 135 mEq/L (ref 135–145)

## 2013-03-31 LAB — CBC
HCT: 27.5 % — ABNORMAL LOW (ref 39.0–52.0)
Hemoglobin: 9.6 g/dL — ABNORMAL LOW (ref 13.0–17.0)
MCH: 32.9 pg (ref 26.0–34.0)
MCHC: 34.9 g/dL (ref 30.0–36.0)
MCV: 94.2 fL (ref 78.0–100.0)
RDW: 18.2 % — ABNORMAL HIGH (ref 11.5–15.5)

## 2013-03-31 MED ORDER — PANTOPRAZOLE SODIUM 40 MG PO TBEC: 40.0000 mg | DELAYED_RELEASE_TABLET | Freq: Two times a day (BID) | ORAL | Status: AC

## 2013-03-31 MED ORDER — INSULIN ASPART 100 UNIT/ML FLEXPEN: 15.0000 [IU] | PEN_INJECTOR | Freq: Two times a day (BID) | SUBCUTANEOUS | Status: AC

## 2013-03-31 MED ORDER — INSULIN DETEMIR 100 UNIT/ML FLEXPEN: 15.0000 [IU] | PEN_INJECTOR | Freq: Every day | SUBCUTANEOUS | Status: AC

## 2013-03-31 NOTE — Discharge Summary (Signed)
Physician Discharge Summary  [name]:[id number] DOB: 12-Nov-1950 DOA: 03/28/2013  PCP: Anselmo Pickler, MD  Admit date: 03/28/2013 Discharge date: 03/31/2013  Time spent: 35 minutes  Recommendations for Outpatient Follow-up:  1. Follow up with local GI in 4 weeks for consideration for repeat banding 2. Follow up with PCP in 1 week 3. Follow up with HD as planned MWF   Recommendations for primary care physician for things to follow:  CBC  Discharge Diagnoses:  Active Problems:   ESRD on dialysis   Cirrhosis of liver   GI bleed   Diabetes mellitus, type 2   HTN (hypertension)   HCV infection   Esophageal varices in cirrhosis   Portal hypertensive gastropathy  Discharge Condition: stable  Diet recommendation: diabetic, renal  Filed Weights   03/29/13 0429 03/30/13 2012 03/31/13 0836  Weight: 68.1 kg (150 lb 2.1 oz) 66.906 kg (147 lb 8 oz) 67 kg (147 lb 11.3 oz)   History of present illness:  [name] is a 62 y.o. male has a past medical history significant for end-stage renal disease on hemodialysis Mondays Wednesdays and Fridays, liver cirrhosis due to hepatitis C (remote history of alcohol use), diagnosed in 2007 currently followed at [facility], hypertension, insulin-dependent diabetes mellitus, presents to [facility] emergency room with a chief complaint of coffee-ground emesis and melena for the past 2 days. He says his symptoms have started last Wednesday, and that was the worst day. Workup in the emergency room was pertinent for hemoglobin of 6.6, and he was transferred to [place] for GI evaluation. He states that he had an upper endoscopy "a while ago", and he also had a history of gastric ulcers. He does not report any history of varices, however it is mentioned that he did note that he has a history of varices. He takes an aspirin a day due to coronary artery disease and history of CABG. He also reports occasional chest pains for the last 2  days. He currently denies any fever or chills, has no chest pain, denies abdominal pain. Denies any lightheadedness or dizziness.  Hospital Course:  GI bleed - gastroenterology has been consulted and patient underwent an EGD 11/22, without clear signs of recent bleeding. Patient was found to have esophageal varices now status post banding. He also has changes of moderate portal gastropathy. Patient's diet was advanced and initially tolerating well, however 11/23 am patient had severe nausea/vomiting and poor po intake. Patient with one episode of hypoglycemia while on his home Levemir regimen. It is possible that he also has a component of gastroparesis. He was advised to resume his home insulin at lower doses and check his CBGs before each meal and at bedtime and slowly restart his previous home dose if his sugars are elevated.  Anemia due to #1 - status post transfusion of 2 units of packed red blood cells, stable without further transfusion requirements.  End-stage renal disease - nephrology has been consulted, he underwent HD x 2, last on Monday 11/24 prior to discharge.  Type 2 diabetes - to resume insulin at lower dose.  Hypertension - restart his home blood pressure medications.  HCV  Liver cirrhosis - no history of encephalopathy or ascites  Procedures:  EGD 11/22 ENDOSCOPIC IMPRESSION:  There were four trunks of medium to large distal esophagus varices. None had clear sign of recent bleeding. There were changes of  moderate portal gastropathy throughout his stomach. There were no  gastric varices. There was no  blood in UGI tract. I elected to  band ligate the varices, placed 7 ligating bands in good position.  The examination was otherwise normal.   Consultations:  GI  Nephrology  Discharge Exam: Filed Vitals:   03/31/13 1000 03/31/13 1030 03/31/13 1100 03/31/13 1130  BP: 139/57 141/78 151/73 143/76  Pulse: 58 57 59 59  Temp:      TempSrc:      Resp: 16     Height:       Weight:      SpO2:       General: NAD Cardiovascular: RRR Respiratory: CTA biL  Discharge Instructions    Medication List    STOP taking these medications       aspirin EC 81 MG tablet     omeprazole 20 MG capsule  Commonly known as:  PRILOSEC      TAKE these medications       amLODipine 10 MG tablet  Commonly known as:  NORVASC  Take 10 mg by mouth daily.     Calcium Acetate 667 MG Tabs  Take 2,001 mg by mouth 3 (three) times daily with meals.     carvedilol 25 MG tablet  Commonly known as:  COREG  Take 25 mg by mouth 2 (two) times daily with a meal.     cloNIDine 0.1 MG tablet  Commonly known as:  CATAPRES  Take 0.1 mg by mouth at bedtime.     insulin aspart 100 UNIT/ML Sopn FlexPen  Commonly known as:  NOVOLOG FLEXPEN  Inject 15 Units into the skin 2 (two) times daily.     Insulin Detemir 100 UNIT/ML Sopn  Commonly known as:  LEVEMIR FLEXPEN  Inject 15 Units into the skin daily.     losartan 100 MG tablet  Commonly known as:  COZAAR  Take 100 mg by mouth daily.     meloxicam 15 MG tablet  Commonly known as:  MOBIC  Take 15 mg by mouth daily.     pantoprazole 40 MG tablet  Commonly known as:  PROTONIX  Take 1 tablet (40 mg total) by mouth 2 (two) times daily before a meal.     traZODone 50 MG tablet  Commonly known as:  DESYREL  Take 25 mg by mouth at bedtime.        The results of significant diagnostics from this hospitalization (including imaging, microbiology, ancillary and laboratory) are listed below for reference.    Significant Diagnostic Studies: No results found.  Microbiology: Recent Results (from the past 240 hour(s))  MRSA PCR SCREENING     Status: None   Collection Time    03/28/13 11:55 AM      Result Value Range Status   MRSA by PCR NEGATIVE  NEGATIVE Final   Comment:            The GeneXpert MRSA Assay (FDA     approved for NASAL specimens     only), is one component of a     comprehensive MRSA colonization      surveillance program. It is not     intended to diagnose MRSA     infection nor to guide or     monitor treatment for     MRSA infections.    Labs: Basic Metabolic Panel:  Recent Labs Lab 03/29/13 0135 03/30/13 0855 03/31/13 0545  NA 132*  --  135  K 3.7  --  3.9  CL 94*  --  96  CO2 25  --  26  GLUCOSE 220* 407* 99  BUN 23  --  52*  CREATININE 4.49*  --  9.97*  CALCIUM 7.7*  --  7.9*  PHOS 3.1  --  6.2*   Liver Function Tests:  Recent Labs Lab 03/29/13 0135 03/31/13 0545  ALBUMIN 2.3* 2.2*   CBC:  Recent Labs Lab 03/28/13 1600 03/29/13 0135 03/29/13 1927 03/30/13 0405 03/31/13 0545  WBC 3.9* 3.4* 2.8* 3.8* 4.1  HGB 6.4* 8.9* 8.9* 9.4* 9.6*  HCT 18.0* 24.2* 25.4* 27.4* 27.5*  MCV 97.8 89.3 92.4 93.5 94.2  PLT 54* 52* 58* 59* 58*   Cardiac Enzymes:  Recent Labs Lab 03/28/13 1505 03/28/13 1600 03/29/13 0135  TROPONINI <0.30 <0.30 <0.30   CBG:  Recent Labs Lab 03/30/13 1253 03/30/13 1709 03/30/13 1801 03/30/13 2023 03/31/13 0756  GLUCAP 146* 25* 137* 127* 128*   Signed:  Pamella Pert  [facility] 03/31/2013, 11:36 AM

## 2013-03-31 NOTE — Procedures (Signed)
Assessment/Plan:  1. Hematemesis / GI bleed - EGD 11/22 with band ligation of varices x 7.  2. ESRD - MWF, K + 3.7. HD No heparin. 3. Anemia d/t CKD / ABL - Hgb 9.4 > 8.9 > 6.4 s/p 2 units PRBCs.Stable today 4. MBD (metabolic bone disease) - Ca 7.7 (9 corrected) P 3.1. Phoslo w meals. PTH in range. No op Vit D. 5. HTN/volume - SBPs 130s. Home meds resumed (norvasc, losartan, coreg and clonidine). Reduce EDW today as pt below EDW 6. Nutrition - Full liquids  currently. 7. DM on insulin 8.   Hx cirrhosis  Subjective: Interval History: Tolerating clear liquods No vomiting since last PM.  Objective: Vital signs in last 24 hours: Temp:  [97.6 F (36.4 C)-98.3 F (36.8 C)] 98.2 F (36.8 C) (11/24 0836) Pulse Rate:  [58-73] 58 (11/24 0836) Resp:  [10-16] 10 (11/24 0836) BP: (131-167)/(61-93) 155/78 mmHg (11/24 0836) SpO2:  [96 %-100 %] 99 % (11/24 0836) Weight:  [66.906 kg (147 lb 8 oz)-67 kg (147 lb 11.3 oz)] 67 kg (147 lb 11.3 oz) (11/24 0836) Weight change:   Intake/Output from previous day: 11/23 0701 - 11/24 0700 In: 360 [P.O.:360] Out: -  Intake/Output this shift:    General appearance: alert and cooperative Resp: clear to auscultation bilaterally Chest wall: no tenderness GI: soft, non-tender; bowel sounds normal; no masses,  no organomegaly Extremities: extremities normal, atraumatic, no cyanosis or edema Cor RRR  Lab Results:  Recent Labs  03/30/13 0405 03/31/13 0545  WBC 3.8* 4.1  HGB 9.4* 9.6*  HCT 27.4* 27.5*  PLT 59* 58*   BMET:  Recent Labs  03/29/13 0135 03/30/13 0855 03/31/13 0545  NA 132*  --  135  K 3.7  --  3.9  CL 94*  --  96  CO2 25  --  26  GLUCOSE 220* 407* 99  BUN 23  --  52*  CREATININE 4.49*  --  9.97*  CALCIUM 7.7*  --  7.9*   No results found for this basename: PTH,  in the last 72 hours Iron Studies: No results found for this basename: IRON, TIBC, TRANSFERRIN, FERRITIN,  in the last 72 hours Studies/Results: No results  found.  Scheduled: . amLODipine  10 mg Oral Daily  . carvedilol  25 mg Oral BID WC  . darbepoetin  40 mcg Intravenous Q Fri-HD  . insulin aspart  0-9 Units Subcutaneous TID WC  . insulin detemir  15 Units Subcutaneous Daily  . losartan  100 mg Oral Daily  . multivitamin  1 tablet Oral QHS  . pantoprazole  40 mg Oral BID AC     LOS: 3 days   Kristoff Coonradt C 03/31/2013,8:48 AM

## 2013-03-31 NOTE — Progress Notes (Signed)
CMT (Chasidy) notified pt to go to HD.

## 2014-03-09 ENCOUNTER — Inpatient Hospital Stay (HOSPITAL_COMMUNITY): Payer: Medicare Other

## 2014-03-09 ENCOUNTER — Inpatient Hospital Stay (HOSPITAL_COMMUNITY)
Admission: EM | Admit: 2014-03-09 | Discharge: 2014-04-07 | DRG: 871 | Disposition: E | Payer: Medicare Other | Attending: Internal Medicine | Admitting: Internal Medicine

## 2014-03-09 ENCOUNTER — Emergency Department (HOSPITAL_COMMUNITY): Payer: Medicare Other

## 2014-03-09 ENCOUNTER — Encounter (HOSPITAL_COMMUNITY): Payer: Self-pay | Admitting: Emergency Medicine

## 2014-03-09 DIAGNOSIS — E119 Type 2 diabetes mellitus without complications: Secondary | ICD-10-CM | POA: Diagnosis present

## 2014-03-09 DIAGNOSIS — Z515 Encounter for palliative care: Secondary | ICD-10-CM | POA: Diagnosis not present

## 2014-03-09 DIAGNOSIS — I2699 Other pulmonary embolism without acute cor pulmonale: Secondary | ICD-10-CM

## 2014-03-09 DIAGNOSIS — B1920 Unspecified viral hepatitis C without hepatic coma: Secondary | ICD-10-CM | POA: Diagnosis present

## 2014-03-09 DIAGNOSIS — K7460 Unspecified cirrhosis of liver: Secondary | ICD-10-CM | POA: Diagnosis present

## 2014-03-09 DIAGNOSIS — I8510 Secondary esophageal varices without bleeding: Secondary | ICD-10-CM | POA: Diagnosis present

## 2014-03-09 DIAGNOSIS — N189 Chronic kidney disease, unspecified: Secondary | ICD-10-CM

## 2014-03-09 DIAGNOSIS — N186 End stage renal disease: Secondary | ICD-10-CM | POA: Diagnosis present

## 2014-03-09 DIAGNOSIS — I739 Peripheral vascular disease, unspecified: Secondary | ICD-10-CM | POA: Diagnosis present

## 2014-03-09 DIAGNOSIS — G9341 Metabolic encephalopathy: Secondary | ICD-10-CM | POA: Diagnosis present

## 2014-03-09 DIAGNOSIS — N2581 Secondary hyperparathyroidism of renal origin: Secondary | ICD-10-CM | POA: Diagnosis present

## 2014-03-09 DIAGNOSIS — Z794 Long term (current) use of insulin: Secondary | ICD-10-CM | POA: Diagnosis not present

## 2014-03-09 DIAGNOSIS — Z01818 Encounter for other preprocedural examination: Secondary | ICD-10-CM

## 2014-03-09 DIAGNOSIS — R188 Other ascites: Secondary | ICD-10-CM

## 2014-03-09 DIAGNOSIS — I2510 Atherosclerotic heart disease of native coronary artery without angina pectoris: Secondary | ICD-10-CM | POA: Diagnosis present

## 2014-03-09 DIAGNOSIS — Z452 Encounter for adjustment and management of vascular access device: Secondary | ICD-10-CM

## 2014-03-09 DIAGNOSIS — K766 Portal hypertension: Secondary | ICD-10-CM | POA: Diagnosis present

## 2014-03-09 DIAGNOSIS — Z992 Dependence on renal dialysis: Secondary | ICD-10-CM

## 2014-03-09 DIAGNOSIS — Z66 Do not resuscitate: Secondary | ICD-10-CM

## 2014-03-09 DIAGNOSIS — J9600 Acute respiratory failure, unspecified whether with hypoxia or hypercapnia: Secondary | ICD-10-CM

## 2014-03-09 DIAGNOSIS — A419 Sepsis, unspecified organism: Secondary | ICD-10-CM | POA: Diagnosis present

## 2014-03-09 DIAGNOSIS — E872 Acidosis, unspecified: Secondary | ICD-10-CM | POA: Insufficient documentation

## 2014-03-09 DIAGNOSIS — I120 Hypertensive chronic kidney disease with stage 5 chronic kidney disease or end stage renal disease: Secondary | ICD-10-CM | POA: Diagnosis present

## 2014-03-09 DIAGNOSIS — D696 Thrombocytopenia, unspecified: Secondary | ICD-10-CM | POA: Diagnosis present

## 2014-03-09 DIAGNOSIS — R000 Tachycardia, unspecified: Secondary | ICD-10-CM | POA: Diagnosis present

## 2014-03-09 DIAGNOSIS — R40244 Other coma, without documented Glasgow coma scale score, or with partial score reported: Secondary | ICD-10-CM | POA: Diagnosis not present

## 2014-03-09 DIAGNOSIS — M810 Age-related osteoporosis without current pathological fracture: Secondary | ICD-10-CM | POA: Diagnosis present

## 2014-03-09 DIAGNOSIS — D631 Anemia in chronic kidney disease: Secondary | ICD-10-CM | POA: Diagnosis present

## 2014-03-09 DIAGNOSIS — R6521 Severe sepsis with septic shock: Secondary | ICD-10-CM

## 2014-03-09 DIAGNOSIS — Z951 Presence of aortocoronary bypass graft: Secondary | ICD-10-CM | POA: Diagnosis not present

## 2014-03-09 DIAGNOSIS — Z87891 Personal history of nicotine dependence: Secondary | ICD-10-CM | POA: Diagnosis not present

## 2014-03-09 DIAGNOSIS — K659 Peritonitis, unspecified: Secondary | ICD-10-CM | POA: Diagnosis present

## 2014-03-09 DIAGNOSIS — R55 Syncope and collapse: Secondary | ICD-10-CM | POA: Diagnosis present

## 2014-03-09 HISTORY — DX: Anemia, unspecified: D64.9

## 2014-03-09 HISTORY — DX: Hypotension, unspecified: I95.9

## 2014-03-09 HISTORY — DX: Other ascites: R18.8

## 2014-03-09 HISTORY — DX: Portal hypertension: K76.6

## 2014-03-09 HISTORY — DX: Headache, unspecified: R51.9

## 2014-03-09 HISTORY — DX: Male erectile dysfunction, unspecified: N52.9

## 2014-03-09 HISTORY — DX: Hypoglycemia, unspecified: E16.2

## 2014-03-09 HISTORY — DX: Shortness of breath: R06.02

## 2014-03-09 HISTORY — DX: Secondary hyperparathyroidism of renal origin: N25.81

## 2014-03-09 HISTORY — DX: Disorder of kidney and ureter, unspecified: N28.9

## 2014-03-09 HISTORY — DX: Headache: R51

## 2014-03-09 HISTORY — DX: Age-related osteoporosis without current pathological fracture: M81.0

## 2014-03-09 HISTORY — DX: Unspecified cirrhosis of liver: K74.60

## 2014-03-09 HISTORY — DX: Hypokalemia: E87.6

## 2014-03-09 HISTORY — DX: Pruritus, unspecified: L29.9

## 2014-03-09 LAB — COMPREHENSIVE METABOLIC PANEL
ALK PHOS: 143 U/L — AB (ref 39–117)
ALT: 7 U/L (ref 0–53)
AST: 40 U/L — AB (ref 0–37)
Albumin: 1.5 g/dL — ABNORMAL LOW (ref 3.5–5.2)
Anion gap: 29 — ABNORMAL HIGH (ref 5–15)
BILIRUBIN TOTAL: 1 mg/dL (ref 0.3–1.2)
BUN: 30 mg/dL — ABNORMAL HIGH (ref 6–23)
CHLORIDE: 97 meq/L (ref 96–112)
CO2: 13 mEq/L — ABNORMAL LOW (ref 19–32)
Calcium: 7.6 mg/dL — ABNORMAL LOW (ref 8.4–10.5)
Creatinine, Ser: 8.85 mg/dL — ABNORMAL HIGH (ref 0.50–1.35)
GFR calc Af Amer: 7 mL/min — ABNORMAL LOW (ref >90)
GFR calc non Af Amer: 6 mL/min — ABNORMAL LOW (ref >90)
Glucose, Bld: 226 mg/dL — ABNORMAL HIGH (ref 70–99)
Potassium: 4.6 mEq/L (ref 3.7–5.3)
SODIUM: 139 meq/L (ref 137–147)
TOTAL PROTEIN: 6.5 g/dL (ref 6.0–8.3)

## 2014-03-09 LAB — I-STAT CG4 LACTIC ACID, ED: Lactic Acid, Venous: 12.71 mmol/L — ABNORMAL HIGH (ref 0.5–2.2)

## 2014-03-09 LAB — CBC WITH DIFFERENTIAL/PLATELET
BASOS ABS: 0 10*3/uL (ref 0.0–0.1)
Basophils Relative: 0 % (ref 0–1)
Eosinophils Absolute: 0 10*3/uL (ref 0.0–0.7)
Eosinophils Relative: 0 % (ref 0–5)
HEMATOCRIT: 29.4 % — AB (ref 39.0–52.0)
Hemoglobin: 9.3 g/dL — ABNORMAL LOW (ref 13.0–17.0)
LYMPHS PCT: 14 % (ref 12–46)
Lymphs Abs: 1.1 10*3/uL (ref 0.7–4.0)
MCH: 32.5 pg (ref 26.0–34.0)
MCHC: 31.6 g/dL (ref 30.0–36.0)
MCV: 102.8 fL — AB (ref 78.0–100.0)
Monocytes Absolute: 0.6 10*3/uL (ref 0.1–1.0)
Monocytes Relative: 8 % (ref 3–12)
Neutro Abs: 6.2 10*3/uL (ref 1.7–7.7)
Neutrophils Relative %: 78 % — ABNORMAL HIGH (ref 43–77)
PLATELETS: 70 10*3/uL — AB (ref 150–400)
RBC: 2.86 MIL/uL — ABNORMAL LOW (ref 4.22–5.81)
RDW: 16.6 % — AB (ref 11.5–15.5)
WBC Morphology: INCREASED
WBC: 7.9 10*3/uL (ref 4.0–10.5)

## 2014-03-09 LAB — LACTATE DEHYDROGENASE, PLEURAL OR PERITONEAL FLUID: LD, Fluid: 89 U/L — ABNORMAL HIGH (ref 3–23)

## 2014-03-09 LAB — ALBUMIN, FLUID (OTHER): ALBUMIN FL: 0.3 g/dL

## 2014-03-09 LAB — I-STAT ARTERIAL BLOOD GAS, ED
ACID-BASE DEFICIT: 16 mmol/L — AB (ref 0.0–2.0)
BICARBONATE: 9.5 meq/L — AB (ref 20.0–24.0)
O2 Saturation: 94 %
PH ART: 7.266 — AB (ref 7.350–7.450)
TCO2: 10 mmol/L (ref 0–100)
pCO2 arterial: 21 mmHg — ABNORMAL LOW (ref 35.0–45.0)
pO2, Arterial: 80 mmHg (ref 80.0–100.0)

## 2014-03-09 LAB — CBG MONITORING, ED: GLUCOSE-CAPILLARY: 186 mg/dL — AB (ref 70–99)

## 2014-03-09 LAB — LACTIC ACID, PLASMA
LACTIC ACID, VENOUS: 14.3 mmol/L — AB (ref 0.5–2.2)
Lactic Acid, Venous: 19.5 mmol/L — ABNORMAL HIGH (ref 0.5–2.2)

## 2014-03-09 LAB — GLUCOSE, CAPILLARY
GLUCOSE-CAPILLARY: 112 mg/dL — AB (ref 70–99)
Glucose-Capillary: 201 mg/dL — ABNORMAL HIGH (ref 70–99)
Glucose-Capillary: 158 mg/dL — ABNORMAL HIGH (ref 70–99)

## 2014-03-09 LAB — PROTEIN, BODY FLUID: TOTAL PROTEIN, FLUID: 1.1 g/dL

## 2014-03-09 LAB — PHOSPHORUS: PHOSPHORUS: 8.7 mg/dL — AB (ref 2.3–4.6)

## 2014-03-09 LAB — PROCALCITONIN: Procalcitonin: 2.45 ng/mL

## 2014-03-09 LAB — PROTIME-INR
INR: 1.75 — ABNORMAL HIGH (ref 0.00–1.49)
Prothrombin Time: 20.6 seconds — ABNORMAL HIGH (ref 11.6–15.2)

## 2014-03-09 LAB — CORTISOL: CORTISOL PLASMA: 54.6 ug/dL

## 2014-03-09 LAB — MAGNESIUM: Magnesium: 1.8 mg/dL (ref 1.5–2.5)

## 2014-03-09 LAB — LIPASE, BLOOD: Lipase: 29 U/L (ref 11–59)

## 2014-03-09 LAB — HEPARIN LEVEL (UNFRACTIONATED): Heparin Unfractionated: 0.16 IU/mL — ABNORMAL LOW (ref 0.30–0.70)

## 2014-03-09 LAB — GLUCOSE, PERITONEAL FLUID: GLUCOSE, PERITONEAL FLUID: 187 mg/dL

## 2014-03-09 LAB — TROPONIN I: Troponin I: <0.3 ng/mL (ref <0.30)

## 2014-03-09 LAB — APTT: aPTT: 41 seconds — ABNORMAL HIGH (ref 24–37)

## 2014-03-09 LAB — MRSA PCR SCREENING: MRSA BY PCR: NEGATIVE

## 2014-03-09 MED ORDER — INSULIN ASPART 100 UNIT/ML ~~LOC~~ SOLN
1.0000 [IU] | SUBCUTANEOUS | Status: DC
  Administered 2014-03-09 – 2014-03-10 (×2): 2 [IU] via SUBCUTANEOUS

## 2014-03-09 MED ORDER — SODIUM CHLORIDE 0.9 % IV BOLUS (SEPSIS)
1000.0000 mL | Freq: Once | INTRAVENOUS | Status: AC
  Administered 2014-03-09: 1000 mL via INTRAVENOUS

## 2014-03-09 MED ORDER — PANTOPRAZOLE SODIUM 40 MG IV SOLR
40.0000 mg | Freq: Every day | INTRAVENOUS | Status: DC
  Administered 2014-03-09: 40 mg via INTRAVENOUS
  Filled 2014-03-09 (×2): qty 40

## 2014-03-09 MED ORDER — WHITE PETROLATUM GEL
Status: AC
  Administered 2014-03-09: 0.2
  Filled 2014-03-09: qty 5

## 2014-03-09 MED ORDER — PIPERACILLIN-TAZOBACTAM IN DEX 2-0.25 GM/50ML IV SOLN
2.2500 g | Freq: Three times a day (TID) | INTRAVENOUS | Status: DC
  Administered 2014-03-09 – 2014-03-10 (×3): 2.25 g via INTRAVENOUS
  Filled 2014-03-09 (×5): qty 50

## 2014-03-09 MED ORDER — VANCOMYCIN HCL 500 MG IV SOLR
500.0000 mg | Freq: Once | INTRAVENOUS | Status: AC
  Administered 2014-03-09: 500 mg via INTRAVENOUS
  Filled 2014-03-09: qty 500

## 2014-03-09 MED ORDER — VANCOMYCIN HCL IN DEXTROSE 1-5 GM/200ML-% IV SOLN
1000.0000 mg | Freq: Once | INTRAVENOUS | Status: AC
  Administered 2014-03-09: 1000 mg via INTRAVENOUS
  Filled 2014-03-09: qty 200

## 2014-03-09 MED ORDER — HEPARIN (PORCINE) IN NACL 100-0.45 UNIT/ML-% IJ SOLN
1100.0000 [IU]/h | INTRAMUSCULAR | Status: DC
  Administered 2014-03-09: 900 [IU]/h via INTRAVENOUS
  Filled 2014-03-09 (×2): qty 250

## 2014-03-09 MED ORDER — DEXTROSE 5 % IV SOLN
30.0000 ug/min | INTRAVENOUS | Status: DC
  Administered 2014-03-09: 40 ug/min via INTRAVENOUS
  Administered 2014-03-09: 30 ug/min via INTRAVENOUS
  Administered 2014-03-10: 65 ug/min via INTRAVENOUS
  Filled 2014-03-09 (×3): qty 1

## 2014-03-09 MED ORDER — CETYLPYRIDINIUM CHLORIDE 0.05 % MT LIQD
7.0000 mL | Freq: Two times a day (BID) | OROMUCOSAL | Status: DC
  Administered 2014-03-09 (×2): 7 mL via OROMUCOSAL

## 2014-03-09 MED ORDER — DARBEPOETIN ALFA 40 MCG/0.4ML IJ SOSY: 40.0000 ug | PREFILLED_SYRINGE | INTRAMUSCULAR | Status: DC

## 2014-03-09 MED ORDER — IOHEXOL 300 MG/ML  SOLN
25.0000 mL | INTRAMUSCULAR | Status: AC
  Administered 2014-03-09 (×2): 25 mL via ORAL

## 2014-03-09 MED ORDER — ENOXAPARIN SODIUM 30 MG/0.3ML ~~LOC~~ SOLN
30.0000 mg | SUBCUTANEOUS | Status: DC
  Filled 2014-03-09: qty 0.3

## 2014-03-09 MED ORDER — VANCOMYCIN HCL IN DEXTROSE 750-5 MG/150ML-% IV SOLN
750.0000 mg | INTRAVENOUS | Status: DC
  Filled 2014-03-09: qty 150

## 2014-03-09 MED ORDER — CALCITRIOL 0.25 MCG PO CAPS
0.2500 ug | ORAL_CAPSULE | ORAL | Status: DC
  Filled 2014-03-09: qty 1

## 2014-03-09 MED ORDER — PIPERACILLIN-TAZOBACTAM 3.375 G IVPB 30 MIN
3.3750 g | Freq: Once | INTRAVENOUS | Status: AC
  Administered 2014-03-09: 3.375 g via INTRAVENOUS
  Filled 2014-03-09: qty 50

## 2014-03-09 MED ORDER — ALBUMIN HUMAN 25 % IV SOLN
50.0000 g | Freq: Once | INTRAVENOUS | Status: AC
  Administered 2014-03-09: 50 g via INTRAVENOUS
  Filled 2014-03-09 (×2): qty 200

## 2014-03-09 MED ORDER — SODIUM CHLORIDE 0.9 % IV SOLN
INTRAVENOUS | Status: DC
  Administered 2014-03-09: 11:00:00 via INTRAVENOUS

## 2014-03-09 MED ORDER — IOHEXOL 350 MG/ML SOLN
80.0000 mL | Freq: Once | INTRAVENOUS | Status: AC | PRN
  Administered 2014-03-09: 80 mL via INTRAVENOUS

## 2014-03-09 NOTE — ED Notes (Addendum)
Pt from [facility] with c/o of syncopal episode lasting approx 60 seconds preceded by nausea, 17 minutes into his dialysis session, with systolic BP 60 per staff.  Prior to dialysis pt was hypotensive at 100/56.  Pt A&Ox4 on EMS arrival.  Pt reports needing ascites drained from peritoneal port, last drained last Thurs.  Pt in NAD.  Given 100 mL LR. Paged IV team for dialysis needles to be removed.

## 2014-03-09 NOTE — ED Provider Notes (Signed)
CSN: [id number]     Arrival date & time April 06, 2014  0741 History   First MD Initiated Contact with Patient 2014/04/06 [phone]     Chief Complaint  Patient presents with  . Loss of Consciousness     (Consider location/radiation/quality/duration/timing/severity/associated sxs/prior Treatment) HPI Comments: This is a 63 y/o male with a PMHx of ESRD on dialysis (M,W,F at [facility]), DM2, CAD, HTN, PVD, PUD, hep C, anemia and esophageal varices who presents to the ED via EMS from dialysis after a witnessed syncopal episode preceded by nausea occuring after only 17 minutes of dialysis lasting about 60 seconds. At that time, systolic BP reported to be 60. Prior to start of dialysis, pt was hypotensive at 100/56, and reported nausea. On EMS arrival, pt AAOx3 with systolic BP of 116. Yesterday evening pt reports he was experiencing abdominal pain and distension and took a vicodin. Abdominal pain described as pressure from his ascites. Pt has a peritoneal port for ascites which was last drained on 10/29. En route to the ED, pt had an episode of emesis. Denies fever, chills, chest pain, sob, diarrhea or rectal bleeding. He does not make urine.  Patient is a 63 y.o. male presenting with syncope. The history is provided by the patient and the EMS personnel.  Loss of Consciousness Associated symptoms: nausea and vomiting     Past Medical History  Diagnosis Date  . DM type 2 (diabetes mellitus, type 2)     insulin requiring.   . Coronary artery disease   . Hypertension   . Peripheral vascular disease   . ESRD (end stage renal disease) on dialysis     MWF dialysis schedule  . PUD (peptic ulcer disease) jan 2014    small antral ulcer and gastric erosions on EGD per Dr Coletta Memos in [place].  . Hepatitis C 2007  . GIB (gastrointestinal bleeding) jan 2014    from gastric ulcer  . Anemia in chronic kidney disease   . Esophageal varices 03/2008    on EGD at non [place], found grade  1 esophageal varices.    Past Surgical History  Procedure Laterality Date  . Coronary artery bypass graft  ~ 2007 per pt recall    done in [place].   . Dialysis fistula creation Left 06/2006    left forarm Cimino arteriovenous fistula..  Dr Arbie Cookey  . Thrombectomy and revision of arterioventous (av) goretex  graft  02/2009    Dr Arbie Cookey  . Left forearm av graft.  11/2006    Dr Evelina Dun  . Colonoscopy w/ polypectomy  01/2007    dr Ronn Melena at [facility], rectal mass (path benign) and rectal polyp of undetermined pathology.   . Esophagogastroduodenoscopy  11/20091/2014     2009 finding of grade 1 esophageal varices. 2014 for CG emesis.  Dr Braulio Conte in [place]. found antral ulcer and gastic erosions.   . Esophagogastroduodenoscopy N/A 03/29/2013    Procedure: ESOPHAGOGASTRODUODENOSCOPY (EGD);  Surgeon: Rachael Fee, MD;  Location: [facility] ENDOSCOPY;  Service: Endoscopy;  Laterality: N/A;   No family history on file. History  Substance Use Topics  . Smoking status: Former Games developer  . Smokeless tobacco: Not on file  . Alcohol Use: No    Review of Systems  Cardiovascular: Positive for syncope.  Gastrointestinal: Positive for nausea, vomiting, abdominal pain and abdominal distention.  Neurological: Positive for syncope.  All other systems reviewed and are negative.     Allergies  Ace inhibitors  Home Medications   Prior  to Admission medications   Medication Sig Start Date End Date Taking? Authorizing Provider  amLODipine (NORVASC) 10 MG tablet Take 10 mg by mouth daily.    Historical Provider, MD  Calcium Acetate 667 MG TABS Take 2,001 mg by mouth 3 (three) times daily with meals.    Historical Provider, MD  carvedilol (COREG) 25 MG tablet Take 25 mg by mouth 2 (two) times daily with a meal.    Historical Provider, MD  cloNIDine (CATAPRES) 0.1 MG tablet Take 0.1 mg by mouth at bedtime.    Historical Provider, MD  insulin aspart (NOVOLOG FLEXPEN) 100 UNIT/ML SOPN FlexPen Inject 15 Units  into the skin 2 (two) times daily. 03/31/13   Costin Otelia SergeantM Gherghe, MD  Insulin Detemir (LEVEMIR FLEXPEN) 100 UNIT/ML SOPN Inject 15 Units into the skin daily. 03/31/13   Costin Otelia SergeantM Gherghe, MD  losartan (COZAAR) 100 MG tablet Take 100 mg by mouth daily.    Historical Provider, MD  meloxicam (MOBIC) 15 MG tablet Take 15 mg by mouth daily.    Historical Provider, MD  pantoprazole (PROTONIX) 40 MG tablet Take 1 tablet (40 mg total) by mouth 2 (two) times daily before a meal. 03/31/13   Costin Otelia SergeantM Gherghe, MD  traZODone (DESYREL) 50 MG tablet Take 25 mg by mouth at bedtime.    Historical Provider, MD   BP 80/45 mmHg  Temp(Src) 97.5 F (36.4 C) (Oral)  Resp 20  Ht 5\' 5"  (1.651 m)  SpO2 94% Physical Exam  Constitutional: He is oriented to person, place, and time. He appears well-developed and well-nourished. No distress.  Uncomfortable but in NAD.  HENT:  Head: Normocephalic and atraumatic.  Dry MM.  Eyes: Conjunctivae and EOM are normal. Pupils are equal, round, and reactive to light.  Neck: Normal range of motion. Neck supple. No JVD present.  Cardiovascular: Regular rhythm, normal heart sounds and intact distal pulses.   Tachycardic. No extremity edema.  Pulmonary/Chest: Effort normal and breath sounds normal. No respiratory distress.  Abdominal: Soft. He exhibits distension and ascites. There is tenderness in the epigastric area. There is no rigidity, no rebound and no guarding.    Musculoskeletal: Normal range of motion. He exhibits no edema.  Neurological: He is alert and oriented to person, place, and time. No cranial nerve deficit.  Skin: Skin is warm and dry. He is not diaphoretic.  Psychiatric: He has a normal mood and affect. His behavior is normal.  Speech fluent, goal oriented. Moves limbs without ataxia. Equal grip strength bilateral.  Nursing note and vitals reviewed.   ED Course  Procedures (including critical care time)  CRITICAL CARE Performed by: Celene SkeenHess, Coley Kulikowski   Total  critical care time: 35  Critical care time was exclusive of separately billable procedures and treating other patients.  Critical care was necessary to treat or prevent imminent or life-threatening deterioration.  Critical care was time spent personally by me on the following activities: development of treatment plan with patient and/or surrogate as well as nursing, discussions with consultants, evaluation of patient's response to treatment, examination of patient, obtaining history from patient or surrogate, ordering and performing treatments and interventions, ordering and review of laboratory studies, ordering and review of radiographic studies, pulse oximetry and re-evaluation of patient's condition.  Labs Review Labs Reviewed  CBC WITH DIFFERENTIAL - Abnormal; Notable for the following:    RBC 2.86 (*)    Hemoglobin 9.3 (*)    HCT 29.4 (*)    MCV 102.8 (*)    RDW  16.6 (*)    Platelets 70 (*)    Neutrophils Relative % 78 (*)    All other components within normal limits  COMPREHENSIVE METABOLIC PANEL - Abnormal; Notable for the following:    CO2 13 (*)    Glucose, Bld 226 (*)    BUN 30 (*)    Creatinine, Ser 8.85 (*)    Calcium 7.6 (*)    Albumin 1.5 (*)    AST 40 (*)    Alkaline Phosphatase 143 (*)    GFR calc non Af Amer 6 (*)    GFR calc Af Amer 7 (*)    Anion gap 29 (*)    All other components within normal limits  CBG MONITORING, ED - Abnormal; Notable for the following:    Glucose-Capillary 186 (*)    All other components within normal limits  I-STAT CG4 LACTIC ACID, ED - Abnormal; Notable for the following:    Lactic Acid, Venous 12.71 (*)    All other components within normal limits  CULTURE, BLOOD (ROUTINE X 2)  CULTURE, BLOOD (ROUTINE X 2)  BODY FLUID CULTURE  URINE CULTURE  LIPASE, BLOOD  TROPONIN I  LACTATE DEHYDROGENASE, BODY FLUID  GLUCOSE, PERITONEAL FLUID  PROTEIN, BODY FLUID  ALBUMIN, FLUID  MAGNESIUM  PHOSPHORUS  LACTIC ACID, PLASMA    PROCALCITONIN  CORTISOL  PROTIME-INR  APTT  URINALYSIS, ROUTINE W REFLEX MICROSCOPIC  STREP PNEUMONIAE URINARY ANTIGEN  LEGIONELLA ANTIGEN, URINE  BLOOD GAS, ARTERIAL    Imaging Review Ct Angio Chest Pe W/cm &/or Wo Cm  04/05/2014   CLINICAL DATA:  Syncope.  Dialysis.  EXAM: CT ANGIOGRAPHY CHEST WITH CONTRAST  TECHNIQUE: Multidetector CT imaging of the chest was performed using the standard protocol during bolus administration of intravenous contrast. Multiplanar CT image reconstructions and MIPs were obtained to evaluate the vascular anatomy.  CONTRAST:  80mL OMNIPAQUE IOHEXOL 350 MG/ML SOLN  COMPARISON:  01/23/2014  FINDINGS: On images 1974 and 76 of series 6, there are small filling defects within the right upper lobe pulmonary arteries compatible with small pulmonary emboli. No other visible pulmonary emboli noted. Respiratory motion obscures the pulmonary arteries in the lower lobes. Airspace disease noted peripherally in the right upper lobe. Areas of atelectasis in both lung bases. Elevation of the right hemidiaphragm. No pleural effusions.  Heart is mildly enlarged. Prior CABG. No mediastinal, hilar, or axillary adenopathy. Chest wall soft tissues are unremarkable.  Imaging into the upper abdomen demonstrates large volume ascites. Drainage catheter noted in the right upper quadrant, likely PleurX type catheter.  Review of the MIP images confirms the above findings.  IMPRESSION: Small filling defects and right upper lobe pulmonary arteries compatible with small pulmonary emboli. Associated airspace disease peripherally in the right upper lobe.  Bibasilar atelectasis.  Cardiomegaly.  Large volume ascites in the upper abdomen.   Electronically Signed   By: Charlett NoseKevin  Dover M.D.   On: Oct 24, 2013 10:15   Dg Chest Port 1 View  03/16/2014   CLINICAL DATA:  Syncope.  Hypotensive.  Chronic renal failure  EXAM: PORTABLE CHEST - 1 VIEW  COMPARISON:  January 23, 2014  FINDINGS: There is stable patchy  atelectatic change in both lung bases. There is no frank edema or consolidation. The heart size and pulmonary vascularity are normal. No adenopathy. Patient is status post coronary artery bypass grafting.  IMPRESSION: Chronic atelectatic change in the bases, stable. No new opacity. No change in cardiac silhouette.   Electronically Signed   By: Bretta BangWilliam  Woodruff  M.D.   On: 03/25/2014 08:33     EKG Interpretation   Date/Time:  Monday March 09 2014 07:57:32 EST Ventricular Rate:  113 PR Interval:  130 QRS Duration: 86 QT Interval:  345 QTC Calculation: 473 R Axis:   -45 Text Interpretation:  Sinus tachycardia Inferior infarct, old Anteroseptal  infarct, old rate is faster when compared to prior Confirmed by Bebe Shaggy   MD, DONALD ([id number]) on 04/04/2014 8:05:39 AM      MDM   Final diagnoses:  Syncope  Chronic renal failure, unspecified stage  Lactic acidosis   Pt presenting after an episode of syncope. He is hypotensive and tachycardic. Afebrile. AAOx3. No neuro deficits. Abdomen noted to be distended, has peritoneal port for ascites. Abdominal pain vague, mild epigastric tenderness while cardiac [place] performed by resident Dr. Melene Muller, no obvious pericardial fluid. O2 sat 94% on RA on arrival, decreased to 88% and 81% during [place], placed on 2L, O2 sat 100%. He does not require oxygen at home. Labs pending. Level 1 sepsis initiated given hypotension, tachycardia and lactate of 12.71. Broad spectrum abx ordered. Will also obtain peritoneal fluid for analysis. CXR without acute findings. Cannot r/o PE. Probable CT angio pending labs. Pt also seen by Dr. Bebe Shaggy who agrees with plan. 9:04 AM I spoke with Dr. Kendrick Fries, critical care, who will evaluate patient. Vitals- O2 sat 97% on RA, BP 94/58, P 114, resp 18. 10:36 AM Pt remains stable. Pt evaluated by critical care who will take over care of patient and admit to ICU. 2L cloudy fluid from abdomen. CT angio with small filling defecits and RUL  pulmonary arteries compatible with small pulmonary emboli. Critical care aware.  Discussed with attending Dr. Bebe Shaggy who also evaluated patient and agrees with plan of care.   Kathrynn Speed, PA-C 04/04/2014 1037  Kathrynn Speed, PA-C 03/23/2014 1148

## 2014-03-09 NOTE — Progress Notes (Signed)
HD needles removed, pressure held x 10min by IV team no bleeding however per policy pressure held another 20 min by Bosie ClosJudith NT.  Pressure dressing in place. Consuello Masseimmons, Jaleeyah Munce M

## 2014-03-09 NOTE — ED Provider Notes (Signed)
Patient seen/examined in the Emergency Department in conjunction with Midlevel Provider Hess Patient reports syncopal episode while at dialysis center.   Exam : awake/alert, but ill appearing, he is tachycardic and hypotensive Plan: labs/imaging ordered.  Will follow closely    Joya Gaskinsonald W Herman Mell, MD 18-Aug-2013 [phone]

## 2014-03-09 NOTE — ED Notes (Signed)
Attempted report 

## 2014-03-09 NOTE — ED Notes (Signed)
Pt states he makes very little urine 

## 2014-03-09 NOTE — H&P (Signed)
PULMONARY / CRITICAL CARE MEDICINE   Name: Derek IslamRonald D Kane MRN: [id number] DOB: 1951-03-23    ADMISSION DATE:  03/14/2014   REFERRING MD : EDP   CHIEF COMPLAINT:  Syncope  INITIAL PRESENTATION: Syncope and hypotension  STUDIES:  11/2 CT chest>> small PE, effusions noted, atelectasis, large volume ascites  SIGNIFICANT EVENTS: 11/2 transfer to [facility] with syncope   HISTORY OF PRESENT ILLNESS:   63 yo AAM with a plethora of health issues that include ESRD(MWF HD) Hep C, chronic liver failure with ascites and indwelling catheter, DM2, PUD, GIB, Esophogeal varices,portal hypertension. He presents with 24 hours of abd pain and was unable to tolerate HD for more than 17 minutes this am due to syncope. [facility] and PCCM asked to admit. Presumed sepsis due to lactate and possible PE. PCCM to admit.  PAST MEDICAL HISTORY :   has a past medical history of DM type 2 (diabetes mellitus, type 2); Coronary artery disease; Hypertension; Peripheral vascular disease; ESRD (end stage renal disease) on dialysis; PUD (peptic ulcer disease) (jan 2014); Hepatitis C (2007); GIB (gastrointestinal bleeding) (jan 2014); Anemia in chronic kidney disease; Esophageal varices (03/2008); ESRD (end stage renal disease) on dialysis; Osteoporosis; Anemia; Ascites; Headache; Hypotension; Hypoglycemia; Hypokalemia; Hypopotassemia; Cirrhosis; Erectile dysfunction; Portal hypertension; Pruritic disorder; Secondary hyperparathyroidism of renal origin; and SOB (shortness of breath).  has past surgical history that includes Coronary artery bypass graft (~ 2007 per pt recall); Dialysis fistula creation (Left, 06/2006); Thrombectomy and revision of arterioventous (av) goretex  graft (02/2009); Left forearm AV graft. (11/2006); Colonoscopy w/ polypectomy (01/2007); Esophagogastroduodenoscopy (11/20091/2014 ); and Esophagogastroduodenoscopy (N/A, 03/29/2013). Prior to Admission medications    Medication Sig Start Date End Date Taking? Authorizing Provider  amLODipine (NORVASC) 10 MG tablet Take 10 mg by mouth daily.    Historical Provider, MD  Calcium Acetate 667 MG TABS Take 2,001 mg by mouth 3 (three) times daily with meals.    Historical Provider, MD  carvedilol (COREG) 25 MG tablet Take 25 mg by mouth 2 (two) times daily with a meal.    Historical Provider, MD  cloNIDine (CATAPRES) 0.1 MG tablet Take 0.1 mg by mouth at bedtime.    Historical Provider, MD  insulin aspart (NOVOLOG FLEXPEN) 100 UNIT/ML SOPN FlexPen Inject 15 Units into the skin 2 (two) times daily. 03/31/13   Costin Otelia SergeantM Gherghe, MD  Insulin Detemir (LEVEMIR FLEXPEN) 100 UNIT/ML SOPN Inject 15 Units into the skin daily. 03/31/13   Costin Otelia SergeantM Gherghe, MD  losartan (COZAAR) 100 MG tablet Take 100 mg by mouth daily.    Historical Provider, MD  meloxicam (MOBIC) 15 MG tablet Take 15 mg by mouth daily.    Historical Provider, MD  pantoprazole (PROTONIX) 40 MG tablet Take 1 tablet (40 mg total) by mouth 2 (two) times daily before a meal. 03/31/13   Costin Otelia SergeantM Gherghe, MD  traZODone (DESYREL) 50 MG tablet Take 25 mg by mouth at bedtime.    Historical Provider, MD   Allergies  Allergen Reactions  . Ace Inhibitors     Per pt and wife, pt is not allergic to any medications, however this data was given in report from [place]    FAMILY HISTORY:  has no family status information on file.  SOCIAL HISTORY:  reports that he has quit smoking. He does not have any smokeless tobacco history on file. He reports that he does not drink alcohol or use illicit drugs.  REVIEW OF SYSTEMS:  10  point review of system taken, please see HPI for positives and negatives.   SUBJECTIVE:   VITAL SIGNS: Temp:  [97.5 F (36.4 C)-97.6 F (36.4 C)] 97.6 F (36.4 C) (11/02 0830) Pulse Rate:  [110-114] 112 (11/02 0900) Resp:  [15-28] 15 (11/02 0900) BP: (80-98)/(45-58) 98/48 mmHg (11/02 0900) SpO2:  [94 %-100 %] 96 % (11/02 0900) Weight:  [152  lb 1.9 oz (69 kg)] 152 lb 1.9 oz (69 kg) (11/02 0853) HEMODYNAMICS:   VENTILATOR SETTINGS:   INTAKE / OUTPUT: No intake or output data in the 24 hours ending 04/03/2014 0914  PHYSICAL EXAMINATION: General:  Frail awake AAM in moderated distress  Neuro:  Rass 1, awake and alert, NAD HEENT:  No LAN/JVD Cardiovascular:  HSR RRR murmur Lungs:  Diminished in bases Abdomen:  Tender, catheter intact, ++ bs Musculoskeletal: Intact Skin: warm, dry , intact, no edema  LABS:  CBC  Recent Labs Lab 03/15/2014 0807  WBC 7.9  HGB 9.3*  HCT 29.4*  PLT 70*   Coag's No results for input(s): APTT, INR in the last 168 hours. BMET  Recent Labs Lab 03/11/2014 0807  NA 139  K 4.6  CL 97  CO2 13*  BUN 30*  CREATININE 8.85*  GLUCOSE 226*   Electrolytes  Recent Labs Lab 03/17/2014 0807  CALCIUM 7.6*   Sepsis Markers  Recent Labs Lab 03/22/2014 0815  LATICACIDVEN 12.71*   ABG No results for input(s): PHART, PCO2ART, PO2ART in the last 168 hours. Liver Enzymes  Recent Labs Lab 03/19/2014 0807  AST 40*  ALT 7  ALKPHOS 143*  BILITOT 1.0  ALBUMIN 1.5*   Cardiac Enzymes No results for input(s): TROPONINI, PROBNP in the last 168 hours. Glucose  Recent Labs Lab 03/27/2014 0852  GLUCAP 186*    Imaging No results found.   ASSESSMENT / PLAN:  PULMONARY OETT A: No acute resp distress. On room air with sats >95 % Pulmonary embolism > new, small; family reports he does not mobilize much at home. P:   Follow in ICU Heparin gtt  CARDIOVASCULAR CVL Left FA AV graft A:  Syncope Hypotension Presumed sepsis P:  Gentle hydration Pressors as needed Cards consult 12 lead Follow Cardiac enzymes Doubt PE but is in realm of possible causes of syncope  Cards consult in future for further work up of syncope if needed.  RENAL A:   ESRD with left forearm AV graft. HD MWF with unable to [facility] 11/2 due to syncope P:   May need temp HD cath for CVVH Renal  consult Admit to ICU  GASTROINTESTINAL A:   Recent nausea Ascites from liver dysfunction ++hep C with indwelling catheter. Hx of GIB 11/14 P:   NPO Antiemetics Abd film,may need CT abd for source evaluation  HEMATOLOGIC A:   Chronic anemia Thrombocytopenia Hep C hx P:  Follow H/H transfuse per protocol Follow platelets  Check coags  INFECTIOUS A:   Presumed infection with Lactate >12, chronic indwelling peritoneal catheter most likely suspect. History hep c  P:   BCx2 11/2>> UC na Sputum NA Peritoneal fluid 11/2>>  Abx:  Vanc, start date 11/2, day 0/x  Zoysn, start date 11/2, day 0/x Follow lactate Check pro calcitonin   ENDOCRINE A:   DM2 with out exacerbation   P:   SSI, careful as he will be NPO due to nausea  NEUROLOGIC A:   No acute issues Hx of anxiety P:   RASS goal: 1 Monitor for anxiety in ICU   FAMILY  -  Updates: None at bedside  - Inter-disciplinary family meet or Palliative Care meeting due by:  day 7   TODAY'S SUMMARY:  Admitted to [facility] with syncope, hypotension and  24 hours of nausea. Note he has a indwelling peritoneal drain for chronic ascites.  Lactate found to be 12 and PCCM asked to admit.    Attending:  I have seen and examined the patient with Brett CanalesSteve Minor and agree with the note above.   On my exam he is awake and alert, interactive.  Belly is soft but clearly has ascites, nontender  His peritoneal fluid looks infected and he has a small PE. I question whether or not this PE is real, but will f/u echo to look at RV size/function.  Heparinize for now  Plan antibiotics, discuss HD with renal Hold off on further peritoneal drainage today given hypotension Give albumin for BP support  CC time by me 45 minutes  Heber CarolinaBrent Staci Carver, MD [place] P[facility]M Pager: [phone] Cell: [phone] If no response, call [phone]   03/24/2014, 9:14 AM

## 2014-03-09 NOTE — Progress Notes (Addendum)
ANTIBIOTIC CONSULT NOTE - INITIAL  Pharmacy Consult:  Vancomycin / Zosyn Indication:  Sepsis  Allergies  Allergen Reactions  . Ace Inhibitors     Per pt and wife, pt is not allergic to any medications, however this data was given in report from [place]    Patient Measurements: Height: 5\' 5"  (165.1 cm) Weight: 152 lb 1.9 oz (69 kg) IBW/kg (Calculated) : 61.5  Vital Signs: Temp: 97.6 F (36.4 C) (11/02 0830) Temp Source: Rectal (11/02 0830) BP: 100/52 mmHg (11/02 0910) Pulse Rate: 113 (11/02 0910)   Labs:  Recent Labs  03/20/2014 0807  WBC 7.9  HGB 9.3*  PLT 70*  CREATININE 8.85*   Estimated Creatinine Clearance: 7.5 mL/min (by C-G formula based on Cr of 8.85). No results for input(s): VANCOTROUGH, VANCOPEAK, VANCORANDOM, GENTTROUGH, GENTPEAK, GENTRANDOM, TOBRATROUGH, TOBRAPEAK, TOBRARND, AMIKACINPEAK, AMIKACINTROU, AMIKACIN in the last 72 hours.   Microbiology: No results found for this or any previous visit (from the past 720 hour(s)).  Medical History: Past Medical History  Diagnosis Date  . DM type 2 (diabetes mellitus, type 2)     insulin requiring.   . Coronary artery disease   . Hypertension   . Peripheral vascular disease   . ESRD (end stage renal disease) on dialysis     MWF dialysis schedule  . PUD (peptic ulcer disease) jan 2014    small antral ulcer and gastric erosions on EGD per Dr Coletta Memosobert [place].  . Hepatitis C 2007  . GIB (gastrointestinal bleeding) jan 2014    from gastric ulcer  . Anemia in chronic kidney disease   . Esophageal varices 03/2008    on EGD at non [place], found grade 1 esophageal varices.   . ESRD (end stage renal disease) on dialysis   . Osteoporosis   . Anemia   . Ascites   . Headache   . Hypotension   . Hypoglycemia   . Hypokalemia   . Hypopotassemia   . Cirrhosis   . Erectile dysfunction   . Portal hypertension   . Pruritic disorder   . Secondary hyperparathyroidism of renal origin   . SOB  (shortness of breath)   . Renal insufficiency       Assessment: [age] YOM presented from dialysis center s/p syncopal episode.  He received 17 minutes of HD prior to transfer.  Pharmacy consulted to manage vancomycin and Zosyn for sepsis.   Goal of Therapy:  Vanc pre-HD level:  15-25 mcg/mL   Plan:  - Vanc 500mg  IV x 1 for a total of 1500mg  load, then 750mg  IV q-HD MWF - Zosyn 2.25gm IV Q8H - Monitor HD tolerance, clinical progress, vanc level as indicated    Kitt Ledet D. Laney Potashang, PharmD, BCPS Pager:  [phone] 03/23/2014, 9:58 AM    =======================================   Addendum: - add heparin gtt for new small PE - plts low at 70 (range 52-70), hx Hep C (MD aware) - heparin dosing weight = 69 kg  Goal of Therapy: HL 0.3 - 0.7 units/mL   Plan: - D/C Lovenox - Heparin gtt at 900 units/hr, no bolus - Check 8 hr HL - Daily HL / CBC - F/U ECHO    Kyri Dai D. Laney Potashang, PharmD, BCPS Pager:  [phone] 03/14/2014, 10:41 AM

## 2014-03-09 NOTE — ED Notes (Signed)
2.1 liters peritoneal fluid drained from abdominal cavity.

## 2014-03-09 NOTE — Progress Notes (Signed)
ANTICOAGULATION CONSULT NOTE  Pharmacy Consult for heparin Indication: pulmonary embolus  Allergies  Allergen Reactions  . Ace Inhibitors     Per pt and wife, pt is not allergic to any medications, however this data was given in report from [place]    Patient Measurements: Height: 5\' 5"  (165.1 cm) Weight: 152 lb 1.9 oz (69 kg) IBW/kg (Calculated) : 61.5 Heparin Dosing Weight: 69kg  Vital Signs: Temp: 94.3 F (34.6 C) (11/02 2100) Temp Source: Rectal (11/02 2100) BP: 101/58 mmHg (11/02 2130) Pulse Rate: 110 (11/02 2130)  Labs:  Recent Labs  03/19/2014 0807 03/14/2014 0836 04/06/2014 1036 04/03/2014 2100  HGB 9.3*  --   --   --   HCT 29.4*  --   --   --   PLT 70*  --   --   --   APTT  --   --  41*  --   LABPROT  --   --  20.6*  --   INR  --   --  1.75*  --   HEPARINUNFRC  --   --   --  0.16*  CREATININE 8.85*  --   --   --   TROPONINI  --  <0.30  --   --     Estimated Creatinine Clearance: 7.5 mL/min (by C-G formula based on Cr of 8.85).   Medications:  Heparin @ 900 units/hr  Assessment: Started on heparin this morning for small PE seen on CT angio. No DVT seen on dopplers, echo not yet completed. Initial heparin level low at 0.16units/mL. No bleeding noted. Per RN, no issues with line. Did tell me they will be inserting central lines and heparin will be off for ~2 hours later this evening.   Goal of Therapy:  Heparin level 0.3-0.7 units/ml Monitor platelets by anticoagulation protocol: Yes   Plan:  1. Increase heparin to 1100 units/hr 2. Next heparin level at 0800 (pushed back in anticipation of heparin being held for line placement) 3. Daily heparin level and CBC  Jessamine Barcia D. Anginette Espejo, PharmD, BCPS Clinical Pharmacist Pager: [phone] 04/04/2014 9:55 PM

## 2014-03-09 NOTE — Progress Notes (Addendum)
eLink Physician-Brief Progress Note Patient Name: [name] DOB: [birth year] MRN: [id number]   Date of Service  03/29/2014  HPI/Events of Note  Lactate rising, inspite of volume admin MAP 56  eICU Interventions   Start neo gtt, for goal MAP 65 Consider CVL CT abdomen /pelvis - PO contrast only       Intervention Category Major Interventions: Sepsis - evaluation and management  Tatem Fesler V. 04/02/2014, 5:38 PM

## 2014-03-09 NOTE — ED Notes (Signed)
CBG 186 

## 2014-03-09 NOTE — Progress Notes (Signed)
VASCULAR LAB PRELIMINARY  PRELIMINARY  PRELIMINARY  PRELIMINARY  Bilateral lower extremity venous Dopplers completed.    Preliminary report:  There is no obvious evidence of DVT or SVT noted in the bilateral lower extremities.   Omolara Carol, RVT 03/29/2014, 2:25 PM

## 2014-03-09 NOTE — ED Notes (Signed)
Brett CanalesSteve Minor, CC NP at bedside.

## 2014-03-09 NOTE — ED Notes (Signed)
Paged IV team x 3.

## 2014-03-09 NOTE — ED Notes (Signed)
Lactic acid results given to Dr. Wickline 

## 2014-03-09 NOTE — Consult Note (Signed)
Indication for Consultation:  Management of ESRD/hemodialysis; anemia, hypertension/volume and secondary hyperparathyroidism  HPI: [name] is a 63 y.o. male who was admitted after a syncopal episode on HD this AM. He recieves HD MWF @ [facility], was on tx for about 15 mins when his BP dropped- SBP 60- and he was very lightheaded and dizzy, denies loss of consciousness. History of Hep C, liver failure with ascites- had peritoneal cath placed 03/02/14 for drainage, GIB, esophogeal varices, portal HTN and DM. Reports he has been having low BP at HD and minimal fluid is removed with treatment, he has been taken off all BP meds. He has been having chronic abdominal pain r/t ascites, slightly worse today, he had 2.1 L ascites fluid drained in ED and pain has improved. Reports he doesn't think he can tolerated HD again today, and given hypotension agree to hold off and monitor.   Past Medical History  Diagnosis Date  . DM type 2 (diabetes mellitus, type 2)     insulin requiring.   . Coronary artery disease   . Hypertension   . Peripheral vascular disease   . ESRD (end stage renal disease) on dialysis     MWF dialysis schedule  . PUD (peptic ulcer disease) jan 2014    small antral ulcer and gastric erosions on EGD per Dr Coletta Memosobert Meisenheimer in [place].  . Hepatitis C 2007  . GIB (gastrointestinal bleeding) jan 2014    from gastric ulcer  . Anemia in chronic kidney disease   . Esophageal varices 03/2008    on EGD at non [place], found grade 1 esophageal varices.   . ESRD (end stage renal disease) on dialysis   . Osteoporosis   . Anemia   . Ascites   . Headache   . Hypotension   . Hypoglycemia   . Hypokalemia   . Hypopotassemia   . Cirrhosis   . Erectile dysfunction   . Portal hypertension   . Pruritic disorder   . Secondary hyperparathyroidism of renal origin   . SOB (shortness of breath)   . Renal insufficiency    Past Surgical History  Procedure Laterality Date  .  Coronary artery bypass graft  ~ 2007 per pt recall    done in [place].   . Dialysis fistula creation Left 06/2006    left forarm Cimino arteriovenous fistula..  Dr Arbie CookeyEarly  . Thrombectomy and revision of arterioventous (av) goretex  graft  02/2009    Dr Arbie CookeyEarly  . Left forearm av graft.  11/2006    Dr Evelina DunField's  . Colonoscopy w/ polypectomy  01/2007    [facility], rectal mass (path benign) and rectal polyp of undetermined pathology.   . Esophagogastroduodenoscopy  11/20091/2014     2009 finding of grade 1 esophageal varices. 2014 for CG emesis.  Dr Braulio ConteMeisenheimer in [place]. found antral ulcer and gastic erosions.   . Esophagogastroduodenoscopy N/A 03/29/2013    Procedure: ESOPHAGOGASTRODUODENOSCOPY (EGD);  Surgeon: Rachael Feeaniel P Jacobs, MD;  [facility];  Service: Endoscopy;  Laterality: N/A;   No family history on file. Social History:  reports that he has quit smoking. He does not have any smokeless tobacco history on file. He reports that he does not drink alcohol or use illicit drugs. Allergies  Allergen Reactions  . Ace Inhibitors     Per pt and wife, pt is not allergic to any medications, however this data was given in report from [place]   Prior to Admission medications  Medication Sig Start Date End Date Taking? Authorizing Provider  insulin aspart (NOVOLOG FLEXPEN) 100 UNIT/ML SOPN FlexPen Inject 15 Units into the skin 2 (two) times daily. 03/31/13  Yes Costin Otelia Sergeant, MD  Insulin Detemir (LEVEMIR FLEXPEN) 100 UNIT/ML SOPN Inject 15 Units into the skin daily. 03/31/13  Yes Costin Otelia Sergeant, MD  amLODipine (NORVASC) 10 MG tablet Take 10 mg by mouth daily.    Historical Provider, MD  Calcium Acetate 667 MG TABS Take 2,001 mg by mouth 3 (three) times daily with meals.    Historical Provider, MD  carvedilol (COREG) 25 MG tablet Take 25 mg by mouth 2 (two) times daily with a meal.    Historical Provider, MD  cloNIDine (CATAPRES) 0.1 MG tablet Take 0.1 mg by mouth at  bedtime.    Historical Provider, MD  losartan (COZAAR) 100 MG tablet Take 100 mg by mouth daily.    Historical Provider, MD  meloxicam (MOBIC) 15 MG tablet Take 15 mg by mouth daily.    Historical Provider, MD  pantoprazole (PROTONIX) 40 MG tablet Take 1 tablet (40 mg total) by mouth 2 (two) times daily before a meal. 03/31/13   Costin Otelia Sergeant, MD  traZODone (DESYREL) 50 MG tablet Take 25 mg by mouth at bedtime.    Historical Provider, MD   Current Facility-Administered Medications  Medication Dose Route Frequency Provider Last Rate Last Dose  . 0.9 %  sodium chloride infusion   Intravenous Continuous Vilinda Blanks Minor, NP 50 mL/hr at 03/30/2014 1102    . albumin human 25 % solution 50 g  50 g Intravenous Once Lupita Leash, MD      . heparin ADULT infusion 100 units/mL (25000 units/250 mL)  900 Units/hr Intravenous Continuous Lennon Alstrom, [facility] 9 mL/hr at 03/15/2014 1232 900 Units/hr at 03/14/2014 1232  . pantoprazole (PROTONIX) injection 40 mg  40 mg Intravenous QHS William S Minor, NP      . piperacillin-tazobactam (ZOSYN) IVPB 2.25 g  2.25 g Intravenous 3 times per day Thuy Dien Dang, [facility]      . vancomycin (VANCOCIN) IVPB 750 mg/150 ml premix  750 mg Intravenous Q M,W,F-HD Lennon Alstrom, [facility]       Labs: Basic Metabolic Panel:  Recent Labs Lab 04/02/2014 0807  NA 139  K 4.6  CL 97  CO2 13*  GLUCOSE 226*  BUN 30*  CREATININE 8.85*  CALCIUM 7.6*   Liver Function Tests:  Recent Labs Lab 03/15/2014 0807  AST 40*  ALT 7  ALKPHOS 143*  BILITOT 1.0  PROT 6.5  ALBUMIN 1.5*    Recent Labs Lab 03/22/2014 0807  LIPASE 29   No results for input(s): AMMONIA in the last 168 hours. CBC:  Recent Labs Lab 04/02/2014 0807  WBC 7.9  NEUTROABS 6.2  HGB 9.3*  HCT 29.4*  MCV 102.8*  PLT 70*   Cardiac Enzymes:  Recent Labs Lab 03/13/2014 0836  TROPONINI <0.30   CBG:  Recent Labs Lab 03/16/2014 0852  GLUCAP 186*   Iron Studies: No results for input(s): IRON, TIBC,  TRANSFERRIN, FERRITIN in the last 72 hours. Studies/Results: Dg Abd 1 View  03/23/2014   CLINICAL DATA:  Abdominal pain and distention  EXAM: ABDOMEN - 1 VIEW  COMPARISON:  CT abdomen and pelvis January 23, 2014  FINDINGS: There is moderate stool in the colon. The bowel gas pattern is unremarkable. No obstruction or free air is seen on this supine examination. There is extensive arterial vascular calcification.  IMPRESSION: Overall bowel gas pattern unremarkable.  Moderate stool in colon.   Electronically Signed   By: Bretta Bang M.D.   On: 2014/03/25 12:15   Ct Angio Chest Pe W/cm &/or Wo Cm  25-Mar-2014   CLINICAL DATA:  Syncope.  Dialysis.  EXAM: CT ANGIOGRAPHY CHEST WITH CONTRAST  TECHNIQUE: Multidetector CT imaging of the chest was performed using the standard protocol during bolus administration of intravenous contrast. Multiplanar CT image reconstructions and MIPs were obtained to evaluate the vascular anatomy.  CONTRAST:  80mL OMNIPAQUE IOHEXOL 350 MG/ML SOLN  COMPARISON:  01/23/2014  FINDINGS: On images 20 and 76 of series 6, there are small filling defects within the right upper lobe pulmonary arteries compatible with small pulmonary emboli. No other visible pulmonary emboli noted. Respiratory motion obscures the pulmonary arteries in the lower lobes. Airspace disease noted peripherally in the right upper lobe. Areas of atelectasis in both lung bases. Elevation of the right hemidiaphragm. No pleural effusions.  Heart is mildly enlarged. Prior CABG. No mediastinal, hilar, or axillary adenopathy. Chest wall soft tissues are unremarkable.  Imaging into the upper abdomen demonstrates large volume ascites. Drainage catheter noted in the right upper quadrant, likely PleurX type catheter.  Review of the MIP images confirms the above findings.  IMPRESSION: Small filling defects and right upper lobe pulmonary arteries compatible with small pulmonary emboli. Associated airspace disease peripherally in  the right upper lobe.  Bibasilar atelectasis.  Cardiomegaly.  Large volume ascites in the upper abdomen.   Electronically Signed   By: Charlett Nose M.D.   On: March 25, 2014 10:15   Dg Chest Port 1 View  Mar 25, 2014   CLINICAL DATA:  Syncope.  Hypotensive.  Chronic renal failure  EXAM: PORTABLE CHEST - 1 VIEW  COMPARISON:  January 23, 2014  FINDINGS: There is stable patchy atelectatic change in both lung bases. There is no frank edema or consolidation. The heart size and pulmonary vascularity are normal. No adenopathy. Patient is status post coronary artery bypass grafting.  IMPRESSION: Chronic atelectatic change in the bases, stable. No new opacity. No change in cardiac silhouette.   Electronically Signed   By: Bretta Bang M.D.   On: 03/25/14 08:33     Review of Systems: Gen: Reports chronic weakness and poor/fair appetite at best with weight loss. Denies any fever, chills, sweats, malaise, and sleep disorder HEENT: No visual complaints, No history of Retinopathy. Normal external appearance No Epistaxis or Sore throat. No sinusitis.   CV: Reports syncope and palpitations, now resolved. Denies chest pain, angina, orthopnea, PND, peripheral edema, and claudication. Resp: Denies dyspnea at rest, dyspnea with exercise, cough, sputum, wheezing, coughing up blood, and pleurisy. GI: Reports chronic nausea and vomiting. Denies vomitting blood, jaundice, and fecal incontinence.   Denies dysphagia or odynophagia. GU : anuric MS: Denies joint pain, limitation of movement, and swelling, stiffness, low back pain, extremity pain. Denies muscle weakness, cramps, atrophy.  No use of non steroidal antiinflammatory drugs. Derm: Denies rash, itching, dry skin, hives, moles, warts, or unhealing ulcers.  Psych: Denies depression, anxiety, memory loss, suicidal ideation, hallucinations, paranoia, and confusion. Heme: Denies bruising, bleeding, and enlarged lymph nodes. Neuro: No headache.  No diplopia. No  dysarthria.  No dysphasia.  No history of CVA.  No Seizures. No paresthesias.  No weakness. Endocrine  No Thyroid disease.  No Adrenal disease.  Physical Exam: Filed Vitals:   03/25/2014 1040 March 25, 2014 1100 03/25/2014 1130 2014/03/25 1140  BP: 95/61 99/49 98/58  104/64  Pulse:  112  118 118  Temp:      TempSrc:      Resp:  21  17  Height:      Weight:      SpO2:  96% 96% 97%     General: Frail, thin, in no acute distress.  Head: Normocephalic, atraumatic, sclera non-icteric, mucus membranes are moist Neck: Supple. JVD not elevated. No carotid bruits Lungs: Bilat bases dim, Breathing is unlabored, shallow. Heart: Tachy, RRR with S1 S2. No murmurs, rubs, or gallops appreciated. Abdomen: Soft, distended +ascites. Peritoneal cath  M-S:  Appears weak Lower extremities:without edema or ischemic changes, no open wounds  Neuro: Alert and oriented X 3. Moves all extremities spontaneously. Psych:  Responds to questions appropriately with a normal affect. Dialysis Access: L AVF +b/t  Dialysis Orders:  MWF @ [facility] 3hr 15mins 68.5kgs   2K/3Ca+ 450/1.5  NO Heparin L AVF  Calcitriol 0.25 mcg /HD  Aranesp 40 q wends   No Fe Profile 4  Assessment/Plan: 1. Syncope- on HD- has been having chronic issues with hypotension on HD. Symptoms resolved. Troponin negative 2. ? Sepsis- Vanc and zosyn. Blood and fluid cultures pending. No edema on chest xray. WBC 7.9  Hypotension in part due to cirrhosis physiology 3. Pulmonary embolism- new, lower extremity duplex pending. On heparin gtt per pharmacy 4. Ascites / cirrhosis / portal HTN / hep C- 2.1 L removed in ED, peritoneal cath placed 10/26- had been getting routine paracentesis previously 5.  ESRD -  HD MWF, K+4.6. Hold off on HD today 6.  HyPO/volume  -104/64, SBP 80 on arrival to ED  hypotension limiting volume removal in HD- left under edw on 10/30- 66.8kgs 7.  Anemia  - hgb 9.3 cont ESA wends. No FE (last ferritin 1105 and tsat 36) 8.  Metabolic bone  disease -  Corrected Ca+ 9.6, on 3Ca bath. Last PTH 518, phos 4.3. Cont calcitriol and phoslo when diet advanced.  9.  Nutrition - NPO. Alb 1.5- chronically low- fair appetite.  10. Dm- per primary 11. History of GIB- now on heparin gtt for PE- watch CBC  Jetty DuhamelBridget Whelan, NP [facility] Beeper [phone] 03/29/2014, 12:36 PM   Pt seen, examined, agree w assess/plan as above with additions as indicated. 63 yo with ESRD, hep C and cirrhosis with portal HTN and ascites presenting with LOC event early into HD today associated with BP's in the 60's.  Not grossly vol overloaded, is at dry wt.  No HD today.  Cirrhosis is likely contributing to hypotension, will consider midodrine for BP support.  Will follow.  Vinson Moselleob Kahla Risdon MD pager [phone]    cell [phone] 03/27/2014, 4:07 PM

## 2014-03-10 ENCOUNTER — Inpatient Hospital Stay (HOSPITAL_COMMUNITY): Payer: Medicare Other

## 2014-03-10 DIAGNOSIS — J9600 Acute respiratory failure, unspecified whether with hypoxia or hypercapnia: Secondary | ICD-10-CM

## 2014-03-10 DIAGNOSIS — Z515 Encounter for palliative care: Secondary | ICD-10-CM

## 2014-03-10 DIAGNOSIS — K7460 Unspecified cirrhosis of liver: Secondary | ICD-10-CM

## 2014-03-10 DIAGNOSIS — I517 Cardiomegaly: Secondary | ICD-10-CM

## 2014-03-10 DIAGNOSIS — Z66 Do not resuscitate: Secondary | ICD-10-CM

## 2014-03-10 DIAGNOSIS — R7881 Bacteremia: Secondary | ICD-10-CM

## 2014-03-10 DIAGNOSIS — A4150 Gram-negative sepsis, unspecified: Secondary | ICD-10-CM

## 2014-03-10 LAB — PROTIME-INR
INR: 3.35 — AB (ref 0.00–1.49)
INR: 2.97 — AB (ref 0.00–1.49)
PROTHROMBIN TIME: 34.2 s — AB (ref 11.6–15.2)
Prothrombin Time: 31.1 seconds — ABNORMAL HIGH (ref 11.6–15.2)

## 2014-03-10 LAB — CBC WITH DIFFERENTIAL/PLATELET
BASOS ABS: 0 10*3/uL (ref 0.0–0.1)
Basophils Relative: 0 % (ref 0–1)
EOS ABS: 0 10*3/uL (ref 0.0–0.7)
Eosinophils Relative: 0 % (ref 0–5)
HEMATOCRIT: 25.5 % — AB (ref 39.0–52.0)
Hemoglobin: 8.1 g/dL — ABNORMAL LOW (ref 13.0–17.0)
Lymphocytes Relative: 18 % (ref 12–46)
Lymphs Abs: 1.6 10*3/uL (ref 0.7–4.0)
MCH: 33.2 pg (ref 26.0–34.0)
MCHC: 31.8 g/dL (ref 30.0–36.0)
MCV: 104.5 fL — ABNORMAL HIGH (ref 78.0–100.0)
MONOS PCT: 5 % (ref 3–12)
Monocytes Absolute: 0.5 10*3/uL (ref 0.1–1.0)
NEUTROS ABS: 7 10*3/uL (ref 1.7–7.7)
Neutrophils Relative %: 77 % (ref 43–77)
PLATELETS: 57 10*3/uL — AB (ref 150–400)
RBC: 2.44 MIL/uL — ABNORMAL LOW (ref 4.22–5.81)
RDW: 17 % — AB (ref 11.5–15.5)
WBC Morphology: INCREASED
WBC: 9.1 10*3/uL (ref 4.0–10.5)

## 2014-03-10 LAB — TYPE AND SCREEN
ABO/RH(D): B POS
Antibody Screen: NEGATIVE

## 2014-03-10 LAB — LACTIC ACID, PLASMA
LACTIC ACID, VENOUS: 23.6 mmol/L — AB (ref 0.5–2.2)
Lactic Acid, Venous: 24.8 mmol/L — ABNORMAL HIGH (ref 0.5–2.2)
Lactic Acid, Venous: 24 mmol/L — ABNORMAL HIGH (ref 0.5–2.2)

## 2014-03-10 LAB — GLUCOSE, CAPILLARY
GLUCOSE-CAPILLARY: 101 mg/dL — AB (ref 70–99)
GLUCOSE-CAPILLARY: 109 mg/dL — AB (ref 70–99)
Glucose-Capillary: 161 mg/dL — ABNORMAL HIGH (ref 70–99)
Glucose-Capillary: 88 mg/dL (ref 70–99)

## 2014-03-10 LAB — BLOOD GAS, ARTERIAL
ACID-BASE DEFICIT: 27.7 mmol/L — AB (ref 0.0–2.0)
BICARBONATE: 3.2 meq/L — AB (ref 20.0–24.0)
DRAWN BY: 232811
O2 CONTENT: 2 L/min
O2 Saturation: 90 %
PATIENT TEMPERATURE: 93.2
PO2 ART: 87.3 mmHg (ref 80.0–100.0)
TCO2: 3.8 mmol/L (ref 0–100)
pCO2 arterial: 18 mmHg — CL (ref 35.0–45.0)
pH, Arterial: 6.847 — CL (ref 7.350–7.450)

## 2014-03-10 LAB — POCT I-STAT 3, ART BLOOD GAS (G3+)
Acid-base deficit: 17 mmol/L — ABNORMAL HIGH (ref 0.0–2.0)
BICARBONATE: 7.4 meq/L — AB (ref 20.0–24.0)
O2 Saturation: 99 %
PH ART: 7.335 — AB (ref 7.350–7.450)
PO2 ART: 124 mmHg — AB (ref 80.0–100.0)
Patient temperature: 93
TCO2: 8 mmol/L (ref 0–100)
pCO2 arterial: 13.3 mmHg — CL (ref 35.0–45.0)

## 2014-03-10 LAB — BASIC METABOLIC PANEL
BUN: 31 mg/dL — ABNORMAL HIGH (ref 6–23)
CALCIUM: 7.6 mg/dL — AB (ref 8.4–10.5)
CO2: <7 mEq/L — CL (ref 19–32)
CREATININE: 9 mg/dL — AB (ref 0.50–1.35)
Chloride: 93 mEq/L — ABNORMAL LOW (ref 96–112)
GFR calc Af Amer: 6 mL/min — ABNORMAL LOW (ref >90)
GFR, EST NON AFRICAN AMERICAN: 6 mL/min — AB (ref >90)
Glucose, Bld: 141 mg/dL — ABNORMAL HIGH (ref 70–99)
Potassium: 6 mEq/L — ABNORMAL HIGH (ref 3.7–5.3)
Sodium: 139 mEq/L (ref 137–147)

## 2014-03-10 LAB — CBC
HCT: 23.3 % — ABNORMAL LOW (ref 39.0–52.0)
HEMOGLOBIN: 7.2 g/dL — AB (ref 13.0–17.0)
MCH: 32.6 pg (ref 26.0–34.0)
MCHC: 30.9 g/dL (ref 30.0–36.0)
MCV: 105.4 fL — ABNORMAL HIGH (ref 78.0–100.0)
Platelets: 75 10*3/uL — ABNORMAL LOW (ref 150–400)
RBC: 2.21 MIL/uL — ABNORMAL LOW (ref 4.22–5.81)
RDW: 17.1 % — ABNORMAL HIGH (ref 11.5–15.5)
WBC: 9.2 10*3/uL (ref 4.0–10.5)

## 2014-03-10 LAB — PHOSPHORUS: Phosphorus: 11.5 mg/dL — ABNORMAL HIGH (ref 2.3–4.6)

## 2014-03-10 LAB — MAGNESIUM: Magnesium: 2 mg/dL (ref 1.5–2.5)

## 2014-03-10 LAB — AMMONIA: Ammonia: 292 umol/L — ABNORMAL HIGH (ref 11–60)

## 2014-03-10 LAB — PROCALCITONIN: PROCALCITONIN: 1.57 ng/mL

## 2014-03-10 LAB — TRIGLYCERIDES: Triglycerides: 242 mg/dL — ABNORMAL HIGH (ref <150)

## 2014-03-10 MED ORDER — SODIUM BICARBONATE 8.4 % IV SOLN
INTRAVENOUS | Status: DC
  Administered 2014-03-10: 05:00:00 via INTRAVENOUS
  Filled 2014-03-10: qty 150

## 2014-03-10 MED ORDER — DEXTROSE 5 % IV SOLN
2.0000 ug/min | INTRAVENOUS | Status: DC
  Administered 2014-03-10: 50 ug/min via INTRAVENOUS
  Filled 2014-03-10: qty 4

## 2014-03-10 MED ORDER — PRISMASOL BGK 4/2.5 32-4-2.5 MEQ/L IV SOLN
INTRAVENOUS | Status: DC
  Administered 2014-03-10 (×3): via INTRAVENOUS_CENTRAL
  Filled 2014-03-10 (×9): qty 5000

## 2014-03-10 MED ORDER — PIPERACILLIN-TAZOBACTAM 3.375 G IVPB
3.3750 g | Freq: Four times a day (QID) | INTRAVENOUS | Status: DC
  Administered 2014-03-10: 3.375 g via INTRAVENOUS
  Filled 2014-03-10 (×2): qty 50

## 2014-03-10 MED ORDER — PIPERACILLIN-TAZOBACTAM 3.375 G IVPB 30 MIN
3.3750 g | Freq: Four times a day (QID) | INTRAVENOUS | Status: DC
  Filled 2014-03-10 (×4): qty 50

## 2014-03-10 MED ORDER — FENTANYL CITRATE 0.05 MG/ML IJ SOLN: 100.0000 ug | INTRAMUSCULAR | Status: DC | PRN

## 2014-03-10 MED ORDER — HEPARIN SODIUM (PORCINE) 1000 UNIT/ML DIALYSIS: 1000.0000 [IU] | INTRAMUSCULAR | Status: DC | PRN

## 2014-03-10 MED ORDER — PROPOFOL 10 MG/ML IV EMUL
0.0000 ug/kg/min | INTRAVENOUS | Status: DC
  Administered 2014-03-10: 10 ug/kg/min via INTRAVENOUS
  Administered 2014-03-10: 1000 mg via INTRAVENOUS

## 2014-03-10 MED ORDER — CHLORHEXIDINE GLUCONATE 0.12 % MT SOLN
15.0000 mL | Freq: Two times a day (BID) | OROMUCOSAL | Status: DC
  Administered 2014-03-10: 15 mL via OROMUCOSAL
  Filled 2014-03-10: qty 15

## 2014-03-10 MED ORDER — HEPARIN (PORCINE) 2000 UNITS/L FOR CRRT
INTRAVENOUS_CENTRAL | Status: DC | PRN
  Filled 2014-03-10: qty 1000

## 2014-03-10 MED ORDER — NOREPINEPHRINE BITARTRATE 1 MG/ML IV SOLN
2.0000 ug/min | INTRAVENOUS | Status: DC
  Administered 2014-03-10: 50 ug/min via INTRAVENOUS
  Filled 2014-03-10: qty 4

## 2014-03-10 MED ORDER — CHLORHEXIDINE GLUCONATE 0.12 % MT SOLN: 15.0000 mL | Freq: Two times a day (BID) | OROMUCOSAL | Status: DC

## 2014-03-10 MED ORDER — PROPOFOL 10 MG/ML IV EMUL
INTRAVENOUS | Status: AC
  Administered 2014-03-10: 1000 mg via INTRAVENOUS
  Filled 2014-03-10: qty 100

## 2014-03-10 MED ORDER — LORAZEPAM BOLUS VIA INFUSION
2.0000 mg | INTRAVENOUS | Status: DC | PRN
  Filled 2014-03-10: qty 5

## 2014-03-10 MED ORDER — NOREPINEPHRINE BITARTRATE 1 MG/ML IV SOLN
2.0000 ug/min | INTRAVENOUS | Status: DC
  Filled 2014-03-10: qty 16

## 2014-03-10 MED ORDER — PHENYLEPHRINE HCL 10 MG/ML IJ SOLN
30.0000 ug/min | INTRAVENOUS | Status: DC
  Administered 2014-03-10 (×3): 200 ug/min via INTRAVENOUS
  Filled 2014-03-10 (×4): qty 4

## 2014-03-10 MED ORDER — PRISMASOL BGK 4/2.5 32-4-2.5 MEQ/L IV SOLN
INTRAVENOUS | Status: DC
  Administered 2014-03-10: 06:00:00 via INTRAVENOUS_CENTRAL
  Filled 2014-03-10 (×3): qty 5000

## 2014-03-10 MED ORDER — STERILE WATER FOR INJECTION IV SOLN
INTRAVENOUS | Status: DC
  Administered 2014-03-10 (×2): via INTRAVENOUS_CENTRAL
  Filled 2014-03-10 (×7): qty 150

## 2014-03-10 MED ORDER — SODIUM BICARBONATE 8.4 % IV SOLN
100.0000 meq | Freq: Once | INTRAVENOUS | Status: AC
  Administered 2014-03-10: 100 meq via INTRAVENOUS

## 2014-03-10 MED ORDER — NOREPINEPHRINE BITARTRATE 1 MG/ML IV SOLN
2.0000 ug/min | INTRAVENOUS | Status: DC
  Administered 2014-03-10 (×2): 50 ug/min via INTRAVENOUS
  Filled 2014-03-10 (×2): qty 16

## 2014-03-10 MED ORDER — MORPHINE SULFATE 10 MG/ML IJ SOLN
10.0000 mg/h | INTRAVENOUS | Status: DC
  Administered 2014-03-10: 5 mg/h via INTRAVENOUS
  Filled 2014-03-10: qty 10

## 2014-03-10 MED ORDER — SODIUM BICARBONATE 8.4 % IV SOLN
INTRAVENOUS | Status: AC
  Filled 2014-03-10: qty 100

## 2014-03-10 MED ORDER — CETYLPYRIDINIUM CHLORIDE 0.05 % MT LIQD: 7.0000 mL | Freq: Four times a day (QID) | OROMUCOSAL | Status: DC

## 2014-03-10 MED ORDER — NOREPINEPHRINE BITARTRATE 1 MG/ML IV SOLN: 0.0000 ug/min | Freq: Once | INTRAVENOUS | Status: DC

## 2014-03-10 MED ORDER — MORPHINE BOLUS VIA INFUSION
5.0000 mg | INTRAVENOUS | Status: DC | PRN
  Filled 2014-03-10: qty 20

## 2014-03-10 MED ORDER — MIDAZOLAM HCL 2 MG/2ML IJ SOLN
INTRAMUSCULAR | Status: AC
  Administered 2014-03-10: 2 mg
  Filled 2014-03-10: qty 2

## 2014-03-10 MED ORDER — DEXTROSE 5 % IV SOLN
1.0000 mg/h | INTRAVENOUS | Status: DC
  Administered 2014-03-10: 2 mg/h via INTRAVENOUS
  Filled 2014-03-10: qty 25

## 2014-03-10 MED ORDER — FENTANYL CITRATE 0.05 MG/ML IJ SOLN
INTRAMUSCULAR | Status: AC
  Administered 2014-03-10: 50 ug
  Filled 2014-03-10: qty 2

## 2014-03-11 LAB — BODY FLUID CULTURE

## 2014-03-11 NOTE — Progress Notes (Signed)
CARE MANAGEMENT NOTE 03/11/2014  Patient:  [name],[name]   Account Number:  [id number]  Date Initiated:  03/17/2014  Documentation initiated by:  AMERSON,JULIE  Subjective/Objective Assessment:   Pt adm on 04/06/2014 after syncope at dialysis.  Adm with septic shock--poor prognosis, per MD-possible terminal wean.     Action/Plan:   Will cont to follow/provide support where appropriate.   Anticipated DC Date:  04/04/2014   Anticipated DC Plan:  EXPIRED  In-house referral  Chaplain      DC Planning Services  CM consult      Choice offered to / List presented to:             Status of service:  Completed, signed off Medicare Important Message given?  NA - LOS <3 / Initial given by admissions (If response is "NO", the following Medicare IM given date fields will be blank) Date Medicare IM given:   Medicare IM given by:   Date Additional Medicare IM given:   Additional Medicare IM given by:    Discharge Disposition:  EXPIRED  Per UR Regulation:  Reviewed for med. necessity/level of care/duration of stay  If discussed at Long Length of Stay Meetings, dates discussed:    Comments:

## 2014-03-12 LAB — CULTURE, BLOOD (ROUTINE X 2)

## 2014-03-15 NOTE — Discharge Summary (Signed)
DISCHARGE SUMMARY    Date of admit: 03/30/2014  7:41 AM Date of discharge: 03/12/2014  5:15 PM Length of Stay: 1 days  PCP is ACHREJA, Youlanda MightyMANJEET KAUR, MD   PROBLEM LIST Active Problems:   ESRD on dialysis   HCV infection   Syncope   Septic shock   Peritonitis   Pulmonary embolism   DNAR (do not attempt resuscitation)   Encounter for terminal care    SUMMARY [name] was 63 y.o. patient with    has a past medical history of DM type 2 (diabetes mellitus, type 2); Coronary artery disease; Hypertension; Peripheral vascular disease; ESRD (end stage renal disease) on dialysis; PUD (peptic ulcer disease) (jan 2014); Hepatitis C (2007); GIB (gastrointestinal bleeding) (jan 2014); Anemia in chronic kidney disease; Esophageal varices (03/2008); ESRD (end stage renal disease) on dialysis; Osteoporosis; Anemia; Ascites; Headache; Hypotension; Hypoglycemia; Hypokalemia; Hypopotassemia; Cirrhosis; Erectile dysfunction; Portal hypertension; Pruritic disorder; Secondary hyperparathyroidism of renal origin; SOB (shortness of breath); and Renal insufficiency.   has past surgical history that includes Coronary artery bypass graft (~ 2007 per pt recall); Dialysis fistula creation (Left, 06/2006); Thrombectomy and revision of arterioventous (av) goretex  graft (02/2009); Left forearm AV graft. (11/2006); Colonoscopy w/ polypectomy (01/2007); Esophagogastroduodenoscopy (11/20091/2014 ); and Esophagogastroduodenoscopy (N/A, 03/29/2013).   Admitted on 03/23/2014 with   He presented with 24 hours of abd pain and was unable to tolerate HD for more than 17 minutes this am due to syncope. [facility] and PCCM  admitting Presumed sepsis due to lactate and possible PE.    COURSE 11/2 CT chest >> small PE, effusions noted, atelectasis, large volume ascites 11/2 CT abd/pelvis >> ascites, PD catheter in place, cirrhosis, cholelithiasis, transverse colon  thickening 11/2 Peritoneal fluid >> albumin 0.3, LDH 89, protein 1.1 11/3 Refractory shock/lactic acidosis, VDRF, start CRRT, surgery consulted   On repeat rounds 03/14/2014 He had deteriorated significantly  D/w wife, children, grandkids at bedsdie - emotional scene   They all understand he is gravely ill. They are crying that he likely will die. They do not appropriately want this outcome but understand verything that needs to be done has been done. One daughter in health care field categorically interpreted and told family: he is as good as dead. Wife inconosolable. They all agreed to No code blue and terminal wean. Patient then expired 03/28/2014    SIGNED Dr. Kalman ShanMurali Maisy Newport, M.D., [facility] Pulmonary and Critical Care Medicine Staff Physician [facility] Pager: [phone], If no answer or between  15:00h - 7:00h: call [phone]  03/15/2014 5:10 PM

## 2014-04-07 NOTE — Progress Notes (Addendum)
Chaplain present with family at bedside. Family speaking to patient and supporting each other. Chaplain provided emotional support and gave them "Patient Placement" card since family did not know funeral arrangements at this time. Family appreciative of chaplain's services.    06-10-13 1600  Clinical Encounter Type  Visited With Patient and family together;Health care provider  Visit Type Patient actively dying;Follow-up;Spiritual support  Spiritual Encounters  Spiritual Needs Emotional;Grief support  Stress Factors  Family Stress Factors Loss  Maddox Hlavaty, Mayer MaskerCourtney F, Chaplain 03/15/2014 4:02 PM

## 2014-04-07 NOTE — Progress Notes (Signed)
Pt deceased.  Asystole on the monitor, pupils fixed and dilated, absence of lung and heart sounds verified by Dan MakerJames Marigny Borre RN and Corliss SkainsJuan Claudio RN.  Family at bedside, Dr. Marchelle Gearingamaswamy notified.  Chaplain present.

## 2014-04-07 NOTE — Procedures (Signed)
Central Venous Catheter Insertion Procedure Note [name] [id number] August 23, 1950  Procedure: Insertion of Hemodialysis Catheter Indications: Emergent CRRT  Procedure Details Consent: Unable to obtain consent because of emergent medical necessity. Time Out: Verified patient identification, verified procedure, site/side was marked, verified correct patient position, special equipment/implants available, medications/allergies/relevent history reviewed, required imaging and test results available.  Performed  Maximum sterile technique was used including antiseptics, cap, gloves, gown, hand hygiene, mask and sheet. Skin prep: Chlorhexidine; local anesthetic administered A antimicrobial bonded/coated triple lumen catheter was placed in the right femoral vein due to patient being a dialysis patient using the Seldinger technique.  Evaluation Blood flow good Complications: No apparent complications Patient did tolerate procedure well.  Performed by [name], ACNP using u/s guidance.  I was present for procedure.  Derek HellingVineet Develle Sievers, MD [facility] Pulmonary/Critical Care 03/20/2014, 6:45 AM Pager:  [phone] After 3pm call: [phone]

## 2014-04-07 NOTE — Care Management Note (Unsigned)
    Page 1 of 1   03/27/2014     3:02:52 PM CARE MANAGEMENT NOTE 03/31/2014  Patient:  [name],[name]   Account Number:  [id number]  Date Initiated:  2013-09-04  Documentation initiated by:  Aolanis Crispen  Subjective/Objective Assessment:   Pt adm on 03/08/2014 after syncope at dialysis.  Adm with septic shock--poor prognosis, per MD-possible terminal wean.     Action/Plan:   Will cont to follow/provide support where appropriate.   Anticipated DC Date:     Anticipated DC Plan:    In-house referral  Chaplain      DC Planning Services  CM consult      Choice offered to / List presented to:             Status of service:  In process, will continue to follow Medicare Important Message given?   (If response is "NO", the following Medicare IM given date fields will be blank) Date Medicare IM given:   Medicare IM given by:   Date Additional Medicare IM given:   Additional Medicare IM given by:    Discharge Disposition:    Per UR Regulation:  Reviewed for med. necessity/level of care/duration of stay  If discussed at Long Length of Stay Meetings, dates discussed:    Comments:

## 2014-04-07 NOTE — Procedures (Signed)
Extubation Procedure Note  Patient Details:   Name: [name] DOB: [birth year] MRN: [id number]     Pt terminally extubated per MD order. No distress noted, RT to monitor.  Evaluation  O2 sats: transiently fell during during procedure Complications: No apparent complications Patient did tolerate procedure well. Bilateral Breath Sounds: Clear Suctioning: Oral No  [name], [name] 03/19/2014, 3:35 PM

## 2014-04-07 NOTE — Progress Notes (Signed)
[facility] Progress Note   Subjective: Deteriorated overnight now on vent, CRRT and pressor support. Severe met acidosis, pH 6.9 with ABG around 4 am today. Seen by Gen Surg, suspect possible intra-abd catastrophe, too high risk for surgery, prognosis guarded.   Filed Vitals:   03/30/2014 0645 2014/03/30 0700 03/30/2014 0715 03-30-2014 0726  BP: 121/66 108/65 87/69 87/69   Pulse:    103  Temp:      TempSrc:      Resp: 17 17 17 18   Height:      Weight:      SpO2:    100%   Exam: Frail thin adult male on vent, unresponsive, sedated No jvd Chest occ coarse BS mostly clear Tachy RRR no MRG Abd distended with ascites, RLQ peritoneal cath in place  No LE or UE edema Neuro is sedated Left arm AVF +bruit  CXR no edema  HD: MWF [place] 3h 101mn   68.5kg   2K/3.0 Bath  Heparin none  LUA AVF Calcitriol 0.25 mcg /HD Aranesp 40 q weds No Fe Profile 4     Assessment: 1 Shock / prob sepsis - on mult pressors, IV AB 2 ESRD on CRRT  3 Severe met acidosis d/t shock - will repeat ABG at 10 am 4 Anemia Hb 7.2, cont aranesp 5 Volume is +2 kg, no gross overload 6 Cirrhosis / chronic severe ascites  Plan- cont CRRT, supportive care, repeat ABG soon, keeping even , no AC w CRRT. Prognosis poor.     RKelly SplinterMD  pager [phone]   cell [phone] 111/23/15 7:38 AM     Recent Labs Lab 03/19/2014 0807 03/08/2014 1635 111/23/20150400 [id number]  NA 139  --  139  --   K 4.6  --  6.0*  --   CL 97  --  93*  --   CO2 13*  --  <7*  --   GLUCOSE 226*  --  141*  --   BUN 30*  --  31*  --   CREATININE 8.85*  --  9.00*  --   CALCIUM 7.6*  --  7.6*  --   PHOS  --  8.7*  --  11.5*    Recent Labs Lab 04/06/2014 0807  AST 40*  ALT 7  ALKPHOS 143*  BILITOT 1.0  PROT 6.5  ALBUMIN 1.5*    Recent Labs Lab 03/12/2014 0807 111/23/[id number]  WBC 7.9 9.2  NEUTROABS 6.2  --   HGB 9.3* 7.2*  HCT 29.4* 23.3*  MCV 102.8* 105.4*  PLT 70* 75*   . antiseptic oral rinse  7 mL  Mouth Rinse QID  . chlorhexidine  15 mL Mouth Rinse BID  . [START ON 03/11/2014] darbepoetin (ARANESP) injection - DIALYSIS  40 mcg Intravenous Q Wed-HD  . insulin aspart  1-3 Units Subcutaneous 6 times per day  . pantoprazole (PROTONIX) IV  40 mg Intravenous QHS  . piperacillin-tazobactam (ZOSYN)  IV  2.25 g Intravenous 3 times per day  . sodium bicarbonate      . vancomycin  750 mg Intravenous Q M,W,F-HD   . sodium chloride 10 mL/hr at 1November 23, 20150737  . heparin Stopped (12015-11-230445)  . norepinephrine (LEVOPHED) Adult infusion 50 mcg/min (111/23/150733)  . phenylephrine (NEO-SYNEPHRINE) Adult infusion 200 mcg/min (111/23/20150432)  . dialysis replacement fluid (prismasate) 500 mL/hr at 12015/11/2309407 . dialysate (PRISMASATE) 1,500 mL/hr at 111/23/201506808 . propofol 20 mcg/kg/min (111/23/150726)  . sodium  bicarbonate (isotonic) 1000 mL infusion 200 mL/hr at 03/26/2014 0621   fentaNYL, heparin, heparin

## 2014-04-07 NOTE — Progress Notes (Signed)
Chaplain initiated follow up with pt and family. Family has a strong support system and all present in room. Chaplain checked in specifically with the pt wife. Chaplain intends to check in upon extubation.   Aug 26, 2013 1400  Clinical Encounter Type  Visited With Patient and family together;Health care provider  Visit Type Follow-up;Spiritual support  Spiritual Encounters  Spiritual Needs Emotional  Jiles HaroldStamey, Orit San[place] F, Chaplain 04/05/2014 2:37 PM

## 2014-04-07 NOTE — Progress Notes (Signed)
Wasted remaining 50 ml morphine and 25 ml ativan into sink, witnessed by Monsanto CompanyJuan RN and Electrical engineerachel RN on 2MW.

## 2014-04-07 NOTE — Procedures (Signed)
Central Venous Catheter Insertion Procedure Note [name] [id number] [birth year]  Procedure: Insertion of Central Venous Catheter Indications: Drug and/or fluid administration  Procedure Details Consent: Risks of procedure as well as the alternatives and risks of each were explained to the (patient/caregiver).  Consent for procedure obtained. Time Out: Verified patient identification, verified procedure, site/side was marked, verified correct patient position, special equipment/implants available, medications/allergies/relevent history reviewed, required imaging and test results available.  Performed  Maximum sterile technique was used including antiseptics, cap, gloves, gown, hand hygiene, mask and sheet. Skin prep: Chlorhexidine; local anesthetic administered A antimicrobial bonded/coated triple lumen catheter was placed in the left internal jugular vein using the Seldinger technique. Ultrasound guidance used.Yes.   Catheter placed to 20 cm. Blood aspirated via all 3 ports and then flushed x 3. Line sutured x 2 and dressing applied.  Evaluation Blood flow good Complications: No apparent complications Patient did tolerate procedure well. Chest X-ray ordered to verify placement.  CXR: pending.  Joneen RoachPaul Hoffman, ACNP [facility] Pager [phone] or [phone]

## 2014-04-07 NOTE — Progress Notes (Addendum)
PULMONARY / CRITICAL CARE MEDICINE   Name: Asencion IslamRonald D Stetzel MRN: [id number] DOB: Oct 01, 1950    ADMISSION DATE:  03/08/2014   REFERRING MD : EDP   CHIEF COMPLAINT:  Syncope  INITIAL PRESENTATION:  63 yo male presented with abdominal pain, hypotension, presumed sepsis and found to have small PE.  He has hx of ESRD, Hep C, cirrhosis with ascites/portal HTN/esophageal varices.  STUDIES:  11/2 CT chest >> small PE, effusions noted, atelectasis, large volume ascites 11/2 CT abd/pelvis >> ascites, PD catheter in place, cirrhosis, cholelithiasis, transverse colon thickening 11/2 Peritoneal fluid >> albumin 0.3, LDH 89, protein 1.1  SIGNIFICANT EVENTS: 11/2 transfer from [place] [facility] to [facility] with syncope 11/3 Refractory shock/lactic acidosis, VDRF, start CRRT, surgery consulted  SUBJECTIVE:  Refractory shock/acidosis.  VITAL SIGNS: Temp:  [93 F (33.9 C)-97.6 F (36.4 C)] 93 F (33.9 C) (11/03 0400) Pulse Rate:  [25-118] 95 (11/03 0200) Resp:  [14-32] 21 (11/03 0400) BP: (59-142)/(15-107) 85/51 mmHg (11/03 0400) SpO2:  [56 %-100 %] 97 % (11/03 0330) Weight:  [152 lb 1.9 oz (69 kg)] 152 lb 1.9 oz (69 kg) (11/02 0853) HEMODYNAMICS:   VENTILATOR SETTINGS:   INTAKE / OUTPUT:  Intake/Output Summary (Last 24 hours) at 2013/06/03 0506 Last data filed at 2013/06/03 0400  Gross per 24 hour  Intake 1755.81 ml  Output      0 ml  Net 1755.81 ml    PHYSICAL EXAMINATION: General: critically ill appearing Neuro: somnolent, follows commands when stimulated and moves extremities HEENT: jaundiced, edentulous Cardiovascular: regular, no murmur Lungs: diminished breath sounds, poor air movement, b/l rhonchi Abdomen: PD catheter RUQ, distended, decreased bowel sounds, mild tenderness, no guarding/rebound Musculoskeletal: decreased muscle bulk Skin: cool  LABS: CBC Recent Labs     03/18/2014  0807  WBC  7.9  HGB  9.3*  HCT  29.4*  PLT  70*    Coag's Recent Labs      04/03/2014  1036  APTT  41*  INR  1.75*    BMET Recent Labs     03/21/2014  0807  NA  139  K  4.6  CL  97  CO2  13*  BUN  30*  CREATININE  8.85*  GLUCOSE  226*    Electrolytes Recent Labs     03/13/2014  0807  03/23/2014  1635  CALCIUM  7.6*   --   MG   --   1.8  PHOS   --   8.7*    Sepsis Markers Recent Labs     04/04/2014  0836  PROCALCITON  2.45    ABG Recent Labs     03/15/2014  1059  2013/06/03  0345  PHART  7.266*  6.847*  PCO2ART  21.0*  18.0*  PO2ART  80.0  87.3    Liver Enzymes Recent Labs     04/01/2014  0807  AST  40*  ALT  7  ALKPHOS  143*  BILITOT  1.0  ALBUMIN  1.5*    Cardiac Enzymes Recent Labs     04/03/2014  0836  TROPONINI  <0.30    Glucose Recent Labs     03/08/2014  0852  03/21/2014  1253  03/30/2014  1517  03/26/2014  1947  03/28/2014  2333  2013/06/03  0436  GLUCAP  186*  201*  158*  112*  88  101*    Imaging Ct Abdomen Pelvis Wo Contrast  03/31/2014   CLINICAL DATA:  Abdominal distention and abdominal pain.  EXAM: CT ABDOMEN AND PELVIS WITHOUT CONTRAST  TECHNIQUE: Multidetector CT imaging of the abdomen and pelvis was performed following the standard protocol without IV contrast.  COMPARISON:  01/23/2014  FINDINGS: Linear infiltrates and atelectasis demonstrated in the lung bases. Limited visualization due to motion artifact. Esophagus is mildly dilated and filled with contrast material. This may indicate esophageal dysmotility or reflux.  A large amount of free fluid in the upper abdomen extending along the mid abdomen and pericolic gutters into the pelvis. Hounsfield unit measurements are increased, suggesting complex fluid. Appearance is most consistent with ascites and is similar to prior study. Drainage catheter is in place. Cirrhotic configuration of the liver. Diffuse low-attenuation of the liver makes evaluation of the liver limited. Spleen size is normal. Cholelithiasis. Unenhanced appearance of the pancreas, adrenal glands, inferior  vena cava, and retroperitoneal lymph nodes is unremarkable. Bilateral renal parenchymal atrophy. Vascular calcifications in the kidneys. Probable parapelvic cyst on the left. No hydronephrosis. Diffuse calcification of the abdominal aorta without aneurysm. No free air in the abdomen. Stomach wall is not thickened. Contrast material flows through to the colon without evidence of small bowel obstruction. Small bowel are not dilated but there is suggestion of small bowel wall thickening. Colonic wall thickening particularly in the transverse region. Changes may be related to ascites or could indicate enterocolitis.  Pelvis: Calcification in the prostate gland. Bladder is decompressed. No pelvic mass or lymphadenopathy is identified. Appendix is not identified. No destructive bone lesions.  IMPRESSION: Large amount of diffuse abdominal and pelvic ascites similar to prior study. Increased density of fluid suggest proteinaceous fluid. Hepatic cirrhosis. Cholelithiasis. Thickening of bowel wall and colon suggesting enteritis/colitis. This may be due to ascites or may indicate infectious or inflammatory change. Infiltrates and atelectasis demonstrated in the lung bases. Dilated esophagus.   Electronically Signed   By: Burman NievesWilliam  Stevens M.D.   On: 19-May-2013 23:17   Dg Abd 1 View  03/27/2014   CLINICAL DATA:  Abdominal pain and distention  EXAM: ABDOMEN - 1 VIEW  COMPARISON:  CT abdomen and pelvis January 23, 2014  FINDINGS: There is moderate stool in the colon. The bowel gas pattern is unremarkable. No obstruction or free air is seen on this supine examination. There is extensive arterial vascular calcification.  IMPRESSION: Overall bowel gas pattern unremarkable.  Moderate stool in colon.   Electronically Signed   By: Bretta BangWilliam  Woodruff M.D.   On: 19-May-2013 12:15   Ct Angio Chest Pe W/cm &/or Wo Cm  03/30/2014   CLINICAL DATA:  Syncope.  Dialysis.  EXAM: CT ANGIOGRAPHY CHEST WITH CONTRAST  TECHNIQUE: Multidetector  CT imaging of the chest was performed using the standard protocol during bolus administration of intravenous contrast. Multiplanar CT image reconstructions and MIPs were obtained to evaluate the vascular anatomy.  CONTRAST:  80mL OMNIPAQUE IOHEXOL 350 MG/ML SOLN  COMPARISON:  01/23/2014  FINDINGS: On images 6074 and 76 of series 6, there are small filling defects within the right upper lobe pulmonary arteries compatible with small pulmonary emboli. No other visible pulmonary emboli noted. Respiratory motion obscures the pulmonary arteries in the lower lobes. Airspace disease noted peripherally in the right upper lobe. Areas of atelectasis in both lung bases. Elevation of the right hemidiaphragm. No pleural effusions.  Heart is mildly enlarged. Prior CABG. No mediastinal, hilar, or axillary adenopathy. Chest wall soft tissues are unremarkable.  Imaging into the upper abdomen demonstrates large volume ascites. Drainage catheter noted in the right upper quadrant, likely PleurX type catheter.  Review of the MIP images confirms the above findings.  IMPRESSION: Small filling defects and right upper lobe pulmonary arteries compatible with small pulmonary emboli. Associated airspace disease peripherally in the right upper lobe.  Bibasilar atelectasis.  Cardiomegaly.  Large volume ascites in the upper abdomen.   Electronically Signed   By: Charlett Nose M.D.   On: 04/05/2014 10:15   Dg Chest Port 1 View  03-11-14   CLINICAL DATA:  Follow-up examination for intubation. Shortness of breath.  EXAM: PORTABLE CHEST - 1 VIEW  COMPARISON:  Prior study from earlier the same day.  FINDINGS: Patient is now intubated with the tip of an endotracheal tube positioned 2.8 cm above the carina. Median sternotomy wires of underlying CABG markers again noted. Cardiac and mediastinal silhouettes are stable. Left IJ approach central venous catheter is stable in position with tip overlying the cavoatrial junction.  Lungs are hypoinflated with  bibasilar linear or atelectasis, similar to prior. No definite focal infiltrate. No pneumothorax. No pulmonary edema or pleural effusion.  No acute osseous abnormality.  IMPRESSION: 1. Tip of the endotracheal tube 2.8 cm above the carina. 2. Shallow lung inflation with bibasilar atelectasis.   Electronically Signed   By: Rise Mu M.D.   On: 03/11/2014 05:23   Dg Chest Port 1 View  March 11, 2014   CLINICAL DATA:  Central line placement.  Pulmonary emboli.  EXAM: PORTABLE CHEST - 1 VIEW  COMPARISON:  04/06/2014  FINDINGS: Left jugular central line has been placed. Catheter tip in the lower SVC region. There is a catheter in the right upper abdomen. Low lung volumes with basilar atelectasis. Subtle patchy densities in the right upper lung. Negative for a pneumothorax. Heart size is stable.  IMPRESSION: Central line tip in the lower SVC region. Negative for pneumothorax.  Bilateral volume loss. Subtle patchy densities in the right upper lung.   Electronically Signed   By: Richarda Overlie M.D.   On: 11-Mar-2014 01:18   Dg Chest Port 1 View  03/27/2014   CLINICAL DATA:  Syncope.  Hypotensive.  Chronic renal failure  EXAM: PORTABLE CHEST - 1 VIEW  COMPARISON:  January 23, 2014  FINDINGS: There is stable patchy atelectatic change in both lung bases. There is no frank edema or consolidation. The heart size and pulmonary vascularity are normal. No adenopathy. Patient is status post coronary artery bypass grafting.  IMPRESSION: Chronic atelectatic change in the bases, stable. No new opacity. No change in cardiac silhouette.   Electronically Signed   By: Bretta Bang M.D.   On: 03/09/2014 08:33      ASSESSMENT / PLAN:  PULMONARY ETT 11/03 >>  A: Acute respiratory failure in setting of refractory acidosis and shock. Acute PE >> small. P:   Full vent support F/u CXR, ABG Heparin gtt >> might need to hold in case he needs surgery  CARDIOVASCULAR Lt arm AV graft  Lt IJ CVL 11/03 >>  HD catheter  11/03 >>  A:  Shock >> likely from sepsis. Hx of CAD s/p CABG, HTN. P:  Pressors to keep SBP > 90, MAP > 65 F/u lactic acid Check Echo  RENAL A:   ESRD previously on PD, but now on HD. Lactic acidosis. P:   HD catheter placed to start CRRT 11/03 F/u renal panel, ABG  GASTROINTESTINAL A:   Hx of Hep C with cirrhosis, esophageal varices. Ascites. ?transverse colitis. P:   NPO Surgery consulted 11/03 >> too high risk for surgical intervention F/u LFT's  HEMATOLOGIC A:   Anemia/thrombocytopenia of critical illness and chronic disease. P:  F/u CBC, coag's  INFECTIOUS A:   Septic shock >> likely abdominal source; GNR in blood cx's from 11/02. P:   Day 2 of vancomycin/zosyn, started 11/02 F/u procalcitonin  Peritoneal fluid 11/02 >> Blood 11/02 >> GNR >>   ENDOCRINE A:   DM type II with renal complications. P:   SSI  NEUROLOGIC A:   Acute metabolic encephalopathy. P:   RASS goal: -1   FAMILY  - Updates: None at bedside  - Inter-disciplinary family meet or Palliative Care meeting due by:  day 7   TODAY'S SUMMARY:  He has septic shock with GNR bacteremia.  He has developed acute respiratory failure/encephalopathy 2nd to shock and refractory acidosis.  His prognosis is very poor.    CC time independent of APP time/procedure time by me is 50 minutes.  Coralyn Helling, MD [facility] Pulmonary/Critical Care 03/20/2014, 5:29 AM Pager:  [phone] After 3pm call: [phone]

## 2014-04-07 NOTE — Progress Notes (Signed)
Pt intubated at this time due to decreasing respiratory status. No complications throughout procedure. MD placed on initial vent settings. RT will continue to monitor.

## 2014-04-07 NOTE — Progress Notes (Signed)
*  PRELIMINARY RESULTS* Echocardiogram 2D Echocardiogram has been performed.  [name], [name] 03/24/2014, 11:03 AM

## 2014-04-07 NOTE — Consult Note (Signed)
Reason for Consult:sepsis, hypotension, possible ischemic bowel Referring Physician: Dr. Johnette Abraham. [place]  [name] is an 63 y.o. male.  HPI: I have been consulted emergently by Dr. Jimmy Footman to evaluate this patient in the intensive care unit. He has just been intubated. He was admitted yesterday after developing hypotension during hemodialysis. His dialysis had to be stopped and he was transferred to the hospital. He has multiple chronic medical issues  Including end-stage renal disease and hepatitis C induced cirrhosis. He has a peritoneal catheter in place to drain his ascites. According to the notes, he had been having chronic abdominal pain related to his ascites. Prior to intubation, he denied abdominal pain according to the nurses. At admission yesterday, per the history and physical, he had no abdominal tenderness. He has become progressively  Hypotensive and is now on multiple pressors. He has become increasingly acidotic. His lactic acid level has continued to climb. He is currently on a heparin drip secondary to findings on CT scan consistent with a pulmonary embolism.  Past Medical History  Diagnosis Date  . DM type 2 (diabetes mellitus, type 2)     insulin requiring.   . Coronary artery disease   . Hypertension   . Peripheral vascular disease   . ESRD (end stage renal disease) on dialysis     MWF dialysis schedule  . PUD (peptic ulcer disease) jan 2014    small antral ulcer and gastric erosions on EGD per Dr Rosina Lowenstein in [place].  . Hepatitis C 2007  . GIB (gastrointestinal bleeding) jan 2014    from gastric ulcer  . Anemia in chronic kidney disease   . Esophageal varices 03/2008    on EGD at non [place], found grade 1 esophageal varices.   . ESRD (end stage renal disease) on dialysis   . Osteoporosis   . Anemia   . Ascites   . Headache   . Hypotension   . Hypoglycemia   . Hypokalemia   . Hypopotassemia   . Cirrhosis   . Erectile dysfunction   .  Portal hypertension   . Pruritic disorder   . Secondary hyperparathyroidism of renal origin   . SOB (shortness of breath)   . Renal insufficiency     Past Surgical History  Procedure Laterality Date  . Coronary artery bypass graft  ~ 2007 per pt recall    done in [place].   . Dialysis fistula creation Left 06/2006    left forarm Cimino arteriovenous fistula..  Dr Donnetta Hutching  . Thrombectomy and revision of arterioventous (av) goretex  graft  02/2009    Dr Donnetta Hutching  . Left forearm av graft.  11/2006    Dr Nona Dell  . Colonoscopy w/ polypectomy  01/2007    dr Hiram Comber at [facility], rectal mass (path benign) and rectal polyp of undetermined pathology.   . Esophagogastroduodenoscopy  11/20091/2014     2009 finding of grade 1 esophageal varices. 2014 for CG emesis.  Dr Odie Sera in [place]. found antral ulcer and gastic erosions.   . Esophagogastroduodenoscopy N/A 03/29/2013    Procedure: ESOPHAGOGASTRODUODENOSCOPY (EGD);  Surgeon: Milus Banister, MD;  Location: [place];  Service: Endoscopy;  Laterality: N/A;    History reviewed. No pertinent family history.  Social History:  reports that he has quit smoking. He has never used smokeless tobacco. He reports that he does not drink alcohol or use illicit drugs.  Allergies:  Allergies  Allergen Reactions  . Ace Inhibitors     Per pt  and wife, pt is not allergic to any medications, however this data was given in report from [place]    Medications: I have reviewed the patient's current medications.  Results for orders placed or performed during the hospital encounter of 03/29/2014 (from the past 48 hour(s))  Lipase, blood     Status: None   Collection Time: 03/21/2014  8:07 AM  Result Value Ref Range   Lipase 29 11 - 59 U/L  CBC with Differential     Status: Abnormal   Collection Time: 03/23/2014  8:07 AM  Result Value Ref Range   WBC 7.9 4.0 - 10.5 K/uL   RBC 2.86 (L) 4.22 - 5.81 MIL/uL   Hemoglobin 9.3 (L) 13.0 - 17.0 g/dL    Comment:  REPEATED TO VERIFY   HCT 29.4 (L) 39.0 - 52.0 %   MCV 102.8 (H) 78.0 - 100.0 fL   MCH 32.5 26.0 - 34.0 pg   MCHC 31.6 30.0 - 36.0 g/dL   RDW 16.6 (H) 11.5 - 15.5 %   Platelets 70 (L) 150 - 400 K/uL    Comment: PLATELET COUNT CONFIRMED BY SMEAR   Neutrophils Relative % 78 (H) 43 - 77 %   Lymphocytes Relative 14 12 - 46 %   Monocytes Relative 8 3 - 12 %   Eosinophils Relative 0 0 - 5 %   Basophils Relative 0 0 - 1 %   Neutro Abs 6.2 1.7 - 7.7 K/uL   Lymphs Abs 1.1 0.7 - 4.0 K/uL   Monocytes Absolute 0.6 0.1 - 1.0 K/uL   Eosinophils Absolute 0.0 0.0 - 0.7 K/uL   Basophils Absolute 0.0 0.0 - 0.1 K/uL   RBC Morphology POLYCHROMASIA PRESENT     Comment: BURR CELLS   WBC Morphology INCREASED BANDS (>20% BANDS)   Comprehensive metabolic panel     Status: Abnormal   Collection Time: 04/01/2014  8:07 AM  Result Value Ref Range   Sodium 139 137 - 147 mEq/L   Potassium 4.6 3.7 - 5.3 mEq/L   Chloride 97 96 - 112 mEq/L   CO2 13 (L) 19 - 32 mEq/L   Glucose, Bld 226 (H) 70 - 99 mg/dL   BUN 30 (H) 6 - 23 mg/dL   Creatinine, Ser 8.85 (H) 0.50 - 1.35 mg/dL   Calcium 7.6 (L) 8.4 - 10.5 mg/dL   Total Protein 6.5 6.0 - 8.3 g/dL   Albumin 1.5 (L) 3.5 - 5.2 g/dL   AST 40 (H) 0 - 37 U/L   ALT 7 0 - 53 U/L   Alkaline Phosphatase 143 (H) 39 - 117 U/L   Total Bilirubin 1.0 0.3 - 1.2 mg/dL   GFR calc non Af Amer 6 (L) >90 mL/min   GFR calc Af Amer 7 (L) >90 mL/min    Comment: (NOTE) The eGFR has been calculated using the CKD EPI equation. This calculation has not been validated in all clinical situations. eGFR's persistently <90 mL/min signify possible Chronic Kidney Disease.    Anion gap 29 (H) 5 - 15  Blood culture (routine x 2)     Status: None (Preliminary result)   Collection Time: 03/21/2014  8:07 AM  Result Value Ref Range   Specimen Description BLOOD RIGHT ANTECUBITAL    Special Requests BOTTLES DRAWN AEROBIC AND ANAEROBIC 5CCS    Culture  Setup Time      03/08/2014 15:07 Performed at  [place] Note:  Gram Stain Report Called to,Read Back By and Verified With: MICHELLE TOLER AT 2:05 A.M. ON 04/02/2014 WARRB Performed at [facility]    Report Status PENDING   I-Stat CG4 Lactic Acid, ED     Status: Abnormal   Collection Time: 03/27/2014  8:15 AM  Result Value Ref Range   Lactic Acid, Venous 12.71 (H) 0.5 - 2.2 mmol/L  Troponin I     Status: None   Collection Time: 03/23/2014  8:36 AM  Result Value Ref Range   Troponin I <0.30 <0.30 ng/mL    Comment:        Due to the release kinetics of cTnI, a negative result within the first hours of the onset of symptoms does not rule out myocardial infarction with certainty. If myocardial infarction is still suspected, repeat the test at appropriate intervals.   Procalcitonin     Status: None   Collection Time: 04/01/2014  8:36 AM  Result Value Ref Range   Procalcitonin 2.45 ng/mL    Comment:        Interpretation: PCT > 2 ng/mL: Systemic infection (sepsis) is likely, unless other causes are known. (NOTE)         ICU PCT Algorithm               Non ICU PCT Algorithm    ----------------------------     ------------------------------         PCT < 0.25 ng/mL                 PCT < 0.1 ng/mL     Stopping of antibiotics            Stopping of antibiotics       strongly encouraged.               strongly encouraged.    ----------------------------     ------------------------------       PCT level decrease by               PCT < 0.25 ng/mL       >= 80% from peak PCT       OR PCT 0.25 - 0.5 ng/mL          Stopping of antibiotics                                             encouraged.     Stopping of antibiotics           encouraged.    ----------------------------     ------------------------------       PCT level decrease by              PCT >= 0.25 ng/mL       < 80% from peak PCT        AND PCT >= 0.5 ng/mL            Continuing antibiotics                                                encouraged.       Continuing antibiotics            encouraged.    ----------------------------     ------------------------------  PCT level increase compared          PCT > 0.5 ng/mL         with peak PCT AND          PCT >= 0.5 ng/mL             Escalation of antibiotics                                          strongly encouraged.      Escalation of antibiotics        strongly encouraged.   Cortisol     Status: None   Collection Time: 03/19/2014  8:36 AM  Result Value Ref Range   Cortisol, Plasma 54.6 ug/dL    Comment: (NOTE) AM:  4.3 - 22.4 ug/dL PM:  3.1 - 16.7 ug/dL Performed at [facility]   Blood culture (routine x 2)     Status: None (Preliminary result)   Collection Time: 03/19/2014  8:42 AM  Result Value Ref Range   Specimen Description BLOOD RIGHT HAND    Special Requests BOTTLES DRAWN AEROBIC ONLY 5CC    Culture  Setup Time      04/04/2014 15:08 Performed at [place] Note: Gram Stain Report Called to,Read Back By and Verified With: MICHELLE TOLER AT 2:05 A.M. ON 30-Mar-2014 WARRB Performed at [facility]    Report Status PENDING   CBG monitoring, ED     Status: Abnormal   Collection Time: 04/01/2014  8:52 AM  Result Value Ref Range   Glucose-Capillary 186 (H) 70 - 99 mg/dL  Lactate dehydrogenase, Peritoneal fluid     Status: Abnormal   Collection Time: 03/27/2014 10:05 AM  Result Value Ref Range   LD, Fluid 89 (H) 3 - 23 U/L   Fluid Type-FLDH PERITONEAL     Comment: CORRECTED ON 11/02 AT 1112: PREVIOUSLY REPORTED AS PERITONEAL CAVITY  Glucose, Peritoneal fluid     Status: None   Collection Time: 03/21/2014 10:05 AM  Result Value Ref Range   Glucose, Peritoneal Fluid 187 mg/dL    Comment: NO NORMAL RANGE ESTABLISHED FOR THIS TEST  Protein, Peritoneal fluid     Status: None   Collection Time: 03/20/2014 10:05 AM  Result Value Ref Range   Total protein, fluid 1.1 g/dL    Comment: NO  NORMAL RANGE ESTABLISHED FOR THIS TEST   Fluid Type-FTP PERITONEAL     Comment: CORRECTED ON 11/02 AT 1112: PREVIOUSLY REPORTED AS PERITONEAL CAVITY  Albumin, Peritoneal fluid     Status: None   Collection Time: 03/23/2014 10:05 AM  Result Value Ref Range   Albumin, Fluid 0.3 g/dL    Comment: NO NORMAL RANGE ESTABLISHED FOR THIS TEST   Fluid Type-FALB PERITONEAL     Comment: CORRECTED ON 11/02 AT 1112: PREVIOUSLY REPORTED AS PERITONEAL CAVITY  Body fluid culture     Status: None (Preliminary result)   Collection Time: 03/19/2014 10:05 AM  Result Value Ref Range   Specimen Description PERITONEAL    Special Requests NONE    Gram Stain      RARE WBC PRESENT, PREDOMINANTLY MONONUCLEAR NO ORGANISMS SEEN Performed at [facility]    Culture PENDING    Report Status PENDING   Lactic acid, plasma     Status: Abnormal  Collection Time: 03/22/2014 10:36 AM  Result Value Ref Range   Lactic Acid, Venous 14.3 (H) 0.5 - 2.2 mmol/L  Protime-INR     Status: Abnormal   Collection Time: 03/13/2014 10:36 AM  Result Value Ref Range   Prothrombin Time 20.6 (H) 11.6 - 15.2 seconds   INR 1.75 (H) 0.00 - 1.49  APTT     Status: Abnormal   Collection Time: 03/13/2014 10:36 AM  Result Value Ref Range   aPTT 41 (H) 24 - 37 seconds    Comment:        IF BASELINE aPTT IS ELEVATED, SUGGEST PATIENT RISK ASSESSMENT BE USED TO DETERMINE APPROPRIATE ANTICOAGULANT THERAPY.   I-Stat arterial blood gas, ED     Status: Abnormal   Collection Time: 03/08/2014 10:59 AM  Result Value Ref Range   pH, Arterial 7.266 (L) 7.350 - 7.450   pCO2 arterial 21.0 (L) 35.0 - 45.0 mmHg   pO2, Arterial 80.0 80.0 - 100.0 mmHg   Bicarbonate 9.5 (L) 20.0 - 24.0 mEq/L   TCO2 10 0 - 100 mmol/L   O2 Saturation 94.0 %   Acid-base deficit 16.0 (H) 0.0 - 2.0 mmol/L   Collection site RADIAL, ALLEN'S TEST ACCEPTABLE    Drawn by Operator    Sample type ARTERIAL   MRSA PCR Screening     Status: None   Collection Time: 03/26/2014 12:14  PM  Result Value Ref Range   MRSA by PCR NEGATIVE NEGATIVE    Comment:        The GeneXpert MRSA Assay (FDA approved for NASAL specimens only), is one component of a comprehensive MRSA colonization surveillance program. It is not intended to diagnose MRSA infection nor to guide or monitor treatment for MRSA infections.   Glucose, capillary     Status: Abnormal   Collection Time: 03/18/2014 12:53 PM  Result Value Ref Range   Glucose-Capillary 201 (H) 70 - 99 mg/dL  Glucose, capillary     Status: Abnormal   Collection Time: 03/27/2014  3:17 PM  Result Value Ref Range   Glucose-Capillary 158 (H) 70 - 99 mg/dL  Magnesium     Status: None   Collection Time: 03/24/2014  4:35 PM  Result Value Ref Range   Magnesium 1.8 1.5 - 2.5 mg/dL  Phosphorus     Status: Abnormal   Collection Time: 03/22/2014  4:35 PM  Result Value Ref Range   Phosphorus 8.7 (H) 2.3 - 4.6 mg/dL  Lactic acid, plasma     Status: Abnormal   Collection Time: 03/26/2014  4:35 PM  Result Value Ref Range   Lactic Acid, Venous 19.5 (H) 0.5 - 2.2 mmol/L  Glucose, capillary     Status: Abnormal   Collection Time: 04/05/2014  7:47 PM  Result Value Ref Range   Glucose-Capillary 112 (H) 70 - 99 mg/dL  Heparin level (unfractionated)     Status: Abnormal   Collection Time: 03/08/2014  9:00 PM  Result Value Ref Range   Heparin Unfractionated 0.16 (L) 0.30 - 0.70 IU/mL    Comment:        IF HEPARIN RESULTS ARE BELOW EXPECTED VALUES, AND PATIENT DOSAGE HAS BEEN CONFIRMED, SUGGEST FOLLOW UP TESTING OF ANTITHROMBIN III LEVELS.   Glucose, capillary     Status: None   Collection Time: 03/09/14 11:33 PM  Result Value Ref Range   Glucose-Capillary 88 70 - 99 mg/dL   Comment 1 Documented in Chart    Comment 2 Notify RN   Blood gas, arterial  Status: Abnormal   Collection Time: 03/25/2014  3:45 AM  Result Value Ref Range   O2 Content 2.0 L/min   Delivery systems NASAL CANNULA    pH, Arterial 6.847 (LL) 7.350 - 7.450    Comment:  CRITICAL RESULT CALLED TO, READ BACK BY AND VERIFIED WITH: Rito Ehrlich, RN AT [phone] BY ANNALISSA AGUSTIN,RRT,RCP ON 03/25/2014    pCO2 arterial 18.0 (LL) 35.0 - 45.0 mmHg    Comment: CRITICAL RESULT CALLED TO, READ BACK BY AND VERIFIED WITH: Rito Ehrlich, RN AT [phone] BY ANNALISSA AGUSTIN,RRT,RCP ON 2014/03/25    pO2, Arterial 87.3 80.0 - 100.0 mmHg   Bicarbonate 3.2 (L) 20.0 - 24.0 mEq/L   TCO2 3.8 0 - 100 mmol/L   Acid-base deficit 27.7 (H) 0.0 - 2.0 mmol/L   O2 Saturation 90.0 %   Patient temperature 93.2    Collection site RIGHT BRACHIAL    Drawn by [id number]    Sample type ARTERIAL    Allens test (pass/fail) PASS PASS    Comment: Performed at [facility]  Glucose, capillary     Status: Abnormal   Collection Time: 03/25/2014  4:36 AM  Result Value Ref Range   Glucose-Capillary 101 (H) 70 - 99 mg/dL   Comment 1 Documented in Chart    Comment 2 Notify RN     Ct Abdomen Pelvis Wo Contrast  03/27/2014   CLINICAL DATA:  Abdominal distention and abdominal pain.  EXAM: CT ABDOMEN AND PELVIS WITHOUT CONTRAST  TECHNIQUE: Multidetector CT imaging of the abdomen and pelvis was performed following the standard protocol without IV contrast.  COMPARISON:  01/23/2014  FINDINGS: Linear infiltrates and atelectasis demonstrated in the lung bases. Limited visualization due to motion artifact. Esophagus is mildly dilated and filled with contrast material. This may indicate esophageal dysmotility or reflux.  A large amount of free fluid in the upper abdomen extending along the mid abdomen and pericolic gutters into the pelvis. Hounsfield unit measurements are increased, suggesting complex fluid. Appearance is most consistent with ascites and is similar to prior study. Drainage catheter is in place. Cirrhotic configuration of the liver. Diffuse low-attenuation of the liver makes evaluation of the liver limited. Spleen size is normal. Cholelithiasis. Unenhanced appearance of the pancreas,  adrenal glands, inferior vena cava, and retroperitoneal lymph nodes is unremarkable. Bilateral renal parenchymal atrophy. Vascular calcifications in the kidneys. Probable parapelvic cyst on the left. No hydronephrosis. Diffuse calcification of the abdominal aorta without aneurysm. No free air in the abdomen. Stomach wall is not thickened. Contrast material flows through to the colon without evidence of small bowel obstruction. Small bowel are not dilated but there is suggestion of small bowel wall thickening. Colonic wall thickening particularly in the transverse region. Changes may be related to ascites or could indicate enterocolitis.  Pelvis: Calcification in the prostate gland. Bladder is decompressed. No pelvic mass or lymphadenopathy is identified. Appendix is not identified. No destructive bone lesions.  IMPRESSION: Large amount of diffuse abdominal and pelvic ascites similar to prior study. Increased density of fluid suggest proteinaceous fluid. Hepatic cirrhosis. Cholelithiasis. Thickening of bowel wall and colon suggesting enteritis/colitis. This may be due to ascites or may indicate infectious or inflammatory change. Infiltrates and atelectasis demonstrated in the lung bases. Dilated esophagus.   Electronically Signed   By: Lucienne Capers M.D.   On: 03/21/2014 23:17   Dg Abd 1 View  03/31/2014   CLINICAL DATA:  Abdominal pain and distention  EXAM: ABDOMEN - 1 VIEW  COMPARISON:  CT abdomen and pelvis January 23, 2014  FINDINGS: There is moderate stool in the colon. The bowel gas pattern is unremarkable. No obstruction or free air is seen on this supine examination. There is extensive arterial vascular calcification.  IMPRESSION: Overall bowel gas pattern unremarkable.  Moderate stool in colon.   Electronically Signed   By: Lowella Grip M.D.   On: 03/11/2014 12:15   Ct Angio Chest Pe W/cm &/or Wo Cm  03/28/2014   CLINICAL DATA:  Syncope.  Dialysis.  EXAM: CT ANGIOGRAPHY CHEST WITH CONTRAST   TECHNIQUE: Multidetector CT imaging of the chest was performed using the standard protocol during bolus administration of intravenous contrast. Multiplanar CT image reconstructions and MIPs were obtained to evaluate the vascular anatomy.  CONTRAST:  83m OMNIPAQUE IOHEXOL 350 MG/ML SOLN  COMPARISON:  01/23/2014  FINDINGS: On images 771and 76 of series 6, there are small filling defects within the right upper lobe pulmonary arteries compatible with small pulmonary emboli. No other visible pulmonary emboli noted. Respiratory motion obscures the pulmonary arteries in the lower lobes. Airspace disease noted peripherally in the right upper lobe. Areas of atelectasis in both lung bases. Elevation of the right hemidiaphragm. No pleural effusions.  Heart is mildly enlarged. Prior CABG. No mediastinal, hilar, or axillary adenopathy. Chest wall soft tissues are unremarkable.  Imaging into the upper abdomen demonstrates large volume ascites. Drainage catheter noted in the right upper quadrant, likely PleurX type catheter.  Review of the MIP images confirms the above findings.  IMPRESSION: Small filling defects and right upper lobe pulmonary arteries compatible with small pulmonary emboli. Associated airspace disease peripherally in the right upper lobe.  Bibasilar atelectasis.  Cardiomegaly.  Large volume ascites in the upper abdomen.   Electronically Signed   By: KRolm BaptiseM.D.   On: 03/22/2014 10:15   Dg Chest Port 1 View  1November 19, 2015  CLINICAL DATA:  Central line placement.  Pulmonary emboli.  EXAM: PORTABLE CHEST - 1 VIEW  COMPARISON:  03/08/2014  FINDINGS: Left jugular central line has been placed. Catheter tip in the lower SVC region. There is a catheter in the right upper abdomen. Low lung volumes with basilar atelectasis. Subtle patchy densities in the right upper lung. Negative for a pneumothorax. Heart size is stable.  IMPRESSION: Central line tip in the lower SVC region. Negative for pneumothorax.  Bilateral  volume loss. Subtle patchy densities in the right upper lung.   Electronically Signed   By: AMarkus DaftM.D.   On: 111/19/201501:18   Dg Chest Port 1 View  03/29/2014   CLINICAL DATA:  Syncope.  Hypotensive.  Chronic renal failure  EXAM: PORTABLE CHEST - 1 VIEW  COMPARISON:  January 23, 2014  FINDINGS: There is stable patchy atelectatic change in both lung bases. There is no frank edema or consolidation. The heart size and pulmonary vascularity are normal. No adenopathy. Patient is status post coronary artery bypass grafting.  IMPRESSION: Chronic atelectatic change in the bases, stable. No new opacity. No change in cardiac silhouette.   Electronically Signed   By: WLowella GripM.D.   On: 03/27/2014 08:33    Review of Systems  Unable to perform ROS: intubated   Blood pressure 123/72, pulse 116, temperature 93 F (33.9 C), temperature source Rectal, resp. rate 29, height _0  (1.651 m), weight 152 lb 1.9 oz (69 kg), SpO2 100 %. Physical Exam  Constitutional: He appears well-developed. He appears distressed.  HENT:  Head: Normocephalic and atraumatic.  Right Ear: External ear normal.  Left Ear: External ear normal.  Nose: Nose normal.  Eyes: Pupils are equal, round, and reactive to light.  Muddy sclera  Neck: No tracheal deviation present.  Cardiovascular: Regular rhythm.   tachycardic  Respiratory:  intubated  GI: He exhibits distension.  His abdomen is distended with ascites and a fluid wave. There is no rigidity. Questionable guarding  Neurological:  intubated  Skin: Skin is warm and dry.    Assessment/Plan: Sepsis, acidosis, and hypotension of uncertain etiology  Based on his decline, I suspect he may have some intra-abdominal catastrophe. Based on yesterday's laboratory data, he is a child's C cirrhotic. His perioperative mortality is greater than 80%. Based on his current clinical condition, I do not believe he would survive an exploratory laparotomy. Repeat liver  function tests and an INR are pending. His heparin would need to be stopped and his coags corrected for any emergent exploration. His prognosis is very poor. We will follow him with you  Dalton Mille A 04/04/14, 5:10 AM

## 2014-04-07 NOTE — Progress Notes (Addendum)
Progressive shock: SBP 60s Family at bedside   PULMONARY  Recent Labs Lab March 26, 2014 1059 03/14/2014 0345 03/13/2014 1056  PHART 7.266* 6.847* 7.335*  PCO2ART 21.0* 18.0* 13.3*  PO2ART 80.0 87.3 124.0*  HCO3 9.5* 3.2* 7.4*  TCO2 10 3.8 8  O2SAT 94.0 90.0 99.0    CBC  Recent Labs Lab 26-Mar-2014 0807 03/21/2014 0400 03/14/2014 0905  HGB 9.3* 7.2* 8.1*  HCT 29.4* 23.3* 25.5*  WBC 7.9 9.2 9.1  PLT 70* 75* 57*    COAGULATION  Recent Labs Lab 26-Mar-2014 1036 03/09/2014 0459 03/30/2014 0930  INR 1.75* 3.35* 2.97*    CARDIAC   Recent Labs Lab 2014/03/26 0836  TROPONINI <0.30   No results for input(s): PROBNP in the last 168 hours.   CHEMISTRY  Recent Labs Lab Mar 26, 2014 0807 26-Mar-2014 1635 04/02/2014 0400 03/17/2014 0600  NA 139  --  139  --   K 4.6  --  6.0*  --   CL 97  --  93*  --   CO2 13*  --  <7*  --   GLUCOSE 226*  --  141*  --   BUN 30*  --  31*  --   CREATININE 8.85*  --  9.00*  --   CALCIUM 7.6*  --  7.6*  --   MG  --  1.8  --  2.0  PHOS  --  8.7*  --  11.5*   Estimated Creatinine Clearance: 7.4 mL/min (by C-G formula based on Cr of 9).   LIVER  Recent Labs Lab 2014-03-26 0807 03-26-2014 1036 03/29/2014 0459 03/12/2014 0930  AST 40*  --   --   --   ALT 7  --   --   --   ALKPHOS 143*  --   --   --   BILITOT 1.0  --   --   --   PROT 6.5  --   --   --   ALBUMIN 1.5*  --   --   --   INR  --  1.75* 3.35* 2.97*     INFECTIOUS  Recent Labs Lab Mar 26, 2014 0836  03/08/2014 0430 03/27/2014 0537 04/05/2014 0600 03/29/2014 0905  LATICACIDVEN  --   < > 24.8*  --  24.0* 23.6*  PROCALCITON 2.45  --   --  1.57  --   --   < > = values in this interval not displayed.   ENDOCRINE CBG (last 3)   Recent Labs  26-Mar-2014 2333 03/11/2014 0436 04/01/2014 0741  GLUCAP 88 101* 161*         IMAGING x48h Ct Abdomen Pelvis Wo Contrast  2014-03-26   CLINICAL DATA:  Abdominal distention and abdominal pain.  EXAM: CT ABDOMEN AND PELVIS WITHOUT CONTRAST  TECHNIQUE:  Multidetector CT imaging of the abdomen and pelvis was performed following the standard protocol without IV contrast.  COMPARISON:  01/23/2014  FINDINGS: Linear infiltrates and atelectasis demonstrated in the lung bases. Limited visualization due to motion artifact. Esophagus is mildly dilated and filled with contrast material. This may indicate esophageal dysmotility or reflux.  A large amount of free fluid in the upper abdomen extending along the mid abdomen and pericolic gutters into the pelvis. Hounsfield unit measurements are increased, suggesting complex fluid. Appearance is most consistent with ascites and is similar to prior study. Drainage catheter is in place. Cirrhotic configuration of the liver. Diffuse low-attenuation of the liver makes evaluation of the liver limited. Spleen size is normal. Cholelithiasis. Unenhanced appearance of the  pancreas, adrenal glands, inferior vena cava, and retroperitoneal lymph nodes is unremarkable. Bilateral renal parenchymal atrophy. Vascular calcifications in the kidneys. Probable parapelvic cyst on the left. No hydronephrosis. Diffuse calcification of the abdominal aorta without aneurysm. No free air in the abdomen. Stomach wall is not thickened. Contrast material flows through to the colon without evidence of small bowel obstruction. Small bowel are not dilated but there is suggestion of small bowel wall thickening. Colonic wall thickening particularly in the transverse region. Changes may be related to ascites or could indicate enterocolitis.  Pelvis: Calcification in the prostate gland. Bladder is decompressed. No pelvic mass or lymphadenopathy is identified. Appendix is not identified. No destructive bone lesions.  IMPRESSION: Large amount of diffuse abdominal and pelvic ascites similar to prior study. Increased density of fluid suggest proteinaceous fluid. Hepatic cirrhosis. Cholelithiasis. Thickening of bowel wall and colon suggesting enteritis/colitis. This may  be due to ascites or may indicate infectious or inflammatory change. Infiltrates and atelectasis demonstrated in the lung bases. Dilated esophagus.   Electronically Signed   By: Burman NievesWilliam  Stevens M.D.   On: 03/16/2014 23:17   Dg Abd 1 View  03/14/2014   CLINICAL DATA:  Abdominal pain and distention  EXAM: ABDOMEN - 1 VIEW  COMPARISON:  CT abdomen and pelvis January 23, 2014  FINDINGS: There is moderate stool in the colon. The bowel gas pattern is unremarkable. No obstruction or free air is seen on this supine examination. There is extensive arterial vascular calcification.  IMPRESSION: Overall bowel gas pattern unremarkable.  Moderate stool in colon.   Electronically Signed   By: Bretta BangWilliam  Woodruff M.D.   On: 04/06/2014 12:15   Ct Angio Chest Pe W/cm &/or Wo Cm  03/12/2014   CLINICAL DATA:  Syncope.  Dialysis.  EXAM: CT ANGIOGRAPHY CHEST WITH CONTRAST  TECHNIQUE: Multidetector CT imaging of the chest was performed using the standard protocol during bolus administration of intravenous contrast. Multiplanar CT image reconstructions and MIPs were obtained to evaluate the vascular anatomy.  CONTRAST:  80mL OMNIPAQUE IOHEXOL 350 MG/ML SOLN  COMPARISON:  01/23/2014  FINDINGS: On images 3274 and 76 of series 6, there are small filling defects within the right upper lobe pulmonary arteries compatible with small pulmonary emboli. No other visible pulmonary emboli noted. Respiratory motion obscures the pulmonary arteries in the lower lobes. Airspace disease noted peripherally in the right upper lobe. Areas of atelectasis in both lung bases. Elevation of the right hemidiaphragm. No pleural effusions.  Heart is mildly enlarged. Prior CABG. No mediastinal, hilar, or axillary adenopathy. Chest wall soft tissues are unremarkable.  Imaging into the upper abdomen demonstrates large volume ascites. Drainage catheter noted in the right upper quadrant, likely PleurX type catheter.  Review of the MIP images confirms the above  findings.  IMPRESSION: Small filling defects and right upper lobe pulmonary arteries compatible with small pulmonary emboli. Associated airspace disease peripherally in the right upper lobe.  Bibasilar atelectasis.  Cardiomegaly.  Large volume ascites in the upper abdomen.   Electronically Signed   By: Charlett NoseKevin  Dover M.D.   On: 04/05/2014 10:15   Dg Chest Port 1 View  03/11/2014   CLINICAL DATA:  Follow-up examination for intubation. Shortness of breath.  EXAM: PORTABLE CHEST - 1 VIEW  COMPARISON:  Prior study from earlier the same day.  FINDINGS: Patient is now intubated with the tip of an endotracheal tube positioned 2.8 cm above the carina. Median sternotomy wires of underlying CABG markers again noted. Cardiac and mediastinal silhouettes are  stable. Left IJ approach central venous catheter is stable in position with tip overlying the cavoatrial junction.  Lungs are hypoinflated with bibasilar linear or atelectasis, similar to prior. No definite focal infiltrate. No pneumothorax. No pulmonary edema or pleural effusion.  No acute osseous abnormality.  IMPRESSION: 1. Tip of the endotracheal tube 2.8 cm above the carina. 2. Shallow lung inflation with bibasilar atelectasis.   Electronically Signed   By: Rise MuBenjamin  McClintock M.D.   On: December 18, 2013 05:23   Dg Chest Port 1 View  03/17/2014   CLINICAL DATA:  Central line placement.  Pulmonary emboli.  EXAM: PORTABLE CHEST - 1 VIEW  COMPARISON:  04/05/2014  FINDINGS: Left jugular central line has been placed. Catheter tip in the lower SVC region. There is a catheter in the right upper abdomen. Low lung volumes with basilar atelectasis. Subtle patchy densities in the right upper lung. Negative for a pneumothorax. Heart size is stable.  IMPRESSION: Central line tip in the lower SVC region. Negative for pneumothorax.  Bilateral volume loss. Subtle patchy densities in the right upper lung.   Electronically Signed   By: Richarda OverlieAdam  Henn M.D.   On: December 18, 2013 01:18   Dg Chest  Port 1 View  03/24/2014   CLINICAL DATA:  Syncope.  Hypotensive.  Chronic renal failure  EXAM: PORTABLE CHEST - 1 VIEW  COMPARISON:  January 23, 2014  FINDINGS: There is stable patchy atelectatic change in both lung bases. There is no frank edema or consolidation. The heart size and pulmonary vascularity are normal. No adenopathy. Patient is status post coronary artery bypass grafting.  IMPRESSION: Chronic atelectatic change in the bases, stable. No new opacity. No change in cardiac silhouette.   Electronically Signed   By: Bretta BangWilliam  Woodruff M.D.   On: 04/05/2014 08:33    A) Progressive MODS  P D/w wife, children, grandkids at bedsdie - emotional scene   They all understand he is gravely ill. They are crying that he likely will die. They do not appropriately want this outcome but understand verything that needs to be done has been done. One daughter in health care field categorically interpreted and told family: he is as good as dead. Wife inconosolable. They all agreed to No code blue. Wife unable to decide on terminal wean but prognosis minutes to hours anyways.   For now  - continue current active Rx  - no escalation  - Initiate DNR  - no new interventions and no new labs - maintaint comfort (he is comatose) - dc labs and general orders  - dc diprivan, start morphine and ativan gtt while on vent   - Followup in few hours: reassess with wife abou terminal wean (daughter  Ready but wife crying )  15 min face to face with family and MDT teanm   Dr. Kalman ShanMurali Abdullah Rizzi, M.D., [facility] Pulmonary and Critical Care Medicine Staff Physician [facility] [name] Pulmonary and Critical Care Pager: [phone], If no answer or between  15:00h - 7:00h: call [phone]  03/29/2014 12:41 PM

## 2014-04-07 NOTE — Procedures (Signed)
Intubation Procedure Note Asencion IslamRonald D Spratt [id number] August 22, 1950  Procedure: Intubation Indications: Respiratory insufficiency  Procedure Details Consent: Unable to obtain consent because of emergent medical necessity. Time Out: Verified patient identification, verified procedure, site/side was marked, verified correct patient position, special equipment/implants available, medications/allergies/relevent history reviewed, required imaging and test results available.  Performed  Maximum sterile technique was used including cap, gloves, hand hygiene and mask.  MAC and 3  Given 2 mg versed, 50 mcg fentanyl, 10 mg etomidate.  Evaluation Hemodynamic Status: BP stable throughout; O2 sats: stable throughout Patient's Current Condition: stable Complications: No apparent complications Patient did tolerate procedure well. Chest X-ray ordered to verify placement.  CXR: pending.  I was present for entire procedure.  Coralyn HellingVineet Ronnica Dreese, MD [facility] 04/01/2014, 5:16 AM Pager:  [phone] After 3pm call: [phone]

## 2014-04-07 NOTE — Progress Notes (Signed)
Chaplain paged to pt room. Chaplain provided emotional support primarily for pt wife. 40th anniversary is this Saturday. Pt wife noted that pt has known he was ill for some time. Pt made it a priority to be able to have his wife in his line of sight. Family appears to be appropriately emotional. Meeting with Md will be later today. Chaplain provided emotional support, facilitated storytelling, provided hospitality, and offered prayer. Chaplain will check in. Page chaplain if needed sooner.   03/13/2014 1100  Clinical Encounter Type  Visited With Patient and family together;Health care provider  Visit Type Initial;Spiritual support  Referral From Nurse  Consult/Referral To Chaplain  Spiritual Encounters  Spiritual Needs Prayer  Stress Factors  Family Stress Factors Loss;Health changes  Izreal Kock, Mayer MaskerCourtney F, Chaplain 03/12/2014 \\11 :43 AM

## 2014-04-07 NOTE — Progress Notes (Signed)
Patient ID: [name], male   DOB: [birth year], 63 y.o.   MRN: [id number]    Subjective: Pt covered in a warmer and multiple blankets.  Some jerky head movements.  Objective: Vital signs in last 24 hours: Temp:  [93 F (33.9 C)-97.4 F (36.3 C)] 93 F (33.9 C) (11/03 0748) Pulse Rate:  [25-118] 103 (11/03 0726) Resp:  [14-32] 20 (11/03 0800) BP: (59-142)/(15-124) 81/58 mmHg (11/03 0800) SpO2:  [56 %-100 %] 100 % (11/03 0726) FiO2 (%):  [60 %] 60 % (11/03 0726) Weight:  [152 lb 1.9 oz (69 kg)-155 lb 10.3 oz (70.6 kg)] 155 lb 10.3 oz (70.6 kg) (11/03 0500)    Intake/Output from previous day: 11/02 0701 - 11/03 0700 In: 2581 [I.V.:2431; IV Piggyback:150] Out: 80  Intake/Output this shift: Total I/O In: 211.3 [I.V.:211.3] Out: 233 [Other:233]  PE: Abd: distended with fluid wave, hypoactive BS, unable to determine if he has any abdominal pain at this point.  He does not react to palpation to his abdomen.  Lab Results:   Recent Labs  03/16/2014 0807 04/03/2014 0400  WBC 7.9 9.2  HGB 9.3* 7.2*  HCT 29.4* 23.3*  PLT 70* 75*   BMET  Recent Labs  03/11/2014 0807 2014/04/03 0400  NA 139 139  K 4.6 6.0*  CL 97 93*  CO2 13* <7*  GLUCOSE 226* 141*  BUN 30* 31*  CREATININE 8.85* 9.00*  CALCIUM 7.6* 7.6*   PT/INR  Recent Labs  04/06/2014 1036 03-Apr-2014 0459  LABPROT 20.6* 34.2*  INR 1.75* 3.35*   CMP     Component Value Date/Time   NA 139 2014/04/03 0400   K 6.0* 04/03/14 0400   CL 93* 04/03/14 0400   CO2 <7* 03-Apr-2014 0400   GLUCOSE 141* 04/03/14 0400   BUN 31* 04-03-2014 0400   CREATININE 9.00* Apr 03, 2014 0400   CALCIUM 7.6* 04-03-2014 0400   PROT 6.5 03/17/2014 0807   ALBUMIN 1.5* 03/27/2014 0807   AST 40* 03/16/2014 0807   ALT 7 03/27/2014 0807   ALKPHOS 143* 03/16/2014 0807   BILITOT 1.0 03/25/2014 0807   GFRNONAA 6* 04-03-2014 0400   GFRAA 6* 03-Apr-2014 0400   Lipase     Component Value Date/Time   LIPASE 29 03/29/2014 0807        Studies/Results: Ct Abdomen Pelvis Wo Contrast  03/31/2014   CLINICAL DATA:  Abdominal distention and abdominal pain.  EXAM: CT ABDOMEN AND PELVIS WITHOUT CONTRAST  TECHNIQUE: Multidetector CT imaging of the abdomen and pelvis was performed following the standard protocol without IV contrast.  COMPARISON:  01/23/2014  FINDINGS: Linear infiltrates and atelectasis demonstrated in the lung bases. Limited visualization due to motion artifact. Esophagus is mildly dilated and filled with contrast material. This may indicate esophageal dysmotility or reflux.  A large amount of free fluid in the upper abdomen extending along the mid abdomen and pericolic gutters into the pelvis. Hounsfield unit measurements are increased, suggesting complex fluid. Appearance is most consistent with ascites and is similar to prior study. Drainage catheter is in place. Cirrhotic configuration of the liver. Diffuse low-attenuation of the liver makes evaluation of the liver limited. Spleen size is normal. Cholelithiasis. Unenhanced appearance of the pancreas, adrenal glands, inferior vena cava, and retroperitoneal lymph nodes is unremarkable. Bilateral renal parenchymal atrophy. Vascular calcifications in the kidneys. Probable parapelvic cyst on the left. No hydronephrosis. Diffuse calcification of the abdominal aorta without aneurysm. No free air in the abdomen. Stomach wall is not thickened. Contrast material flows  through to the colon without evidence of small bowel obstruction. Small bowel are not dilated but there is suggestion of small bowel wall thickening. Colonic wall thickening particularly in the transverse region. Changes may be related to ascites or could indicate enterocolitis.  Pelvis: Calcification in the prostate gland. Bladder is decompressed. No pelvic mass or lymphadenopathy is identified. Appendix is not identified. No destructive bone lesions.  IMPRESSION: Large amount of diffuse abdominal and pelvic ascites  similar to prior study. Increased density of fluid suggest proteinaceous fluid. Hepatic cirrhosis. Cholelithiasis. Thickening of bowel wall and colon suggesting enteritis/colitis. This may be due to ascites or may indicate infectious or inflammatory change. Infiltrates and atelectasis demonstrated in the lung bases. Dilated esophagus.   Electronically Signed   By: Burman NievesWilliam  Stevens M.D.   On: 04/03/2014 23:17   Dg Abd 1 View  04/01/2014   CLINICAL DATA:  Abdominal pain and distention  EXAM: ABDOMEN - 1 VIEW  COMPARISON:  CT abdomen and pelvis January 23, 2014  FINDINGS: There is moderate stool in the colon. The bowel gas pattern is unremarkable. No obstruction or free air is seen on this supine examination. There is extensive arterial vascular calcification.  IMPRESSION: Overall bowel gas pattern unremarkable.  Moderate stool in colon.   Electronically Signed   By: Bretta BangWilliam  Woodruff M.D.   On: 03/14/2014 12:15   Ct Angio Chest Pe W/cm &/or Wo Cm  03/08/2014   CLINICAL DATA:  Syncope.  Dialysis.  EXAM: CT ANGIOGRAPHY CHEST WITH CONTRAST  TECHNIQUE: Multidetector CT imaging of the chest was performed using the standard protocol during bolus administration of intravenous contrast. Multiplanar CT image reconstructions and MIPs were obtained to evaluate the vascular anatomy.  CONTRAST:  80mL OMNIPAQUE IOHEXOL 350 MG/ML SOLN  COMPARISON:  01/23/2014  FINDINGS: On images 5874 and 76 of series 6, there are small filling defects within the right upper lobe pulmonary arteries compatible with small pulmonary emboli. No other visible pulmonary emboli noted. Respiratory motion obscures the pulmonary arteries in the lower lobes. Airspace disease noted peripherally in the right upper lobe. Areas of atelectasis in both lung bases. Elevation of the right hemidiaphragm. No pleural effusions.  Heart is mildly enlarged. Prior CABG. No mediastinal, hilar, or axillary adenopathy. Chest wall soft tissues are unremarkable.  Imaging  into the upper abdomen demonstrates large volume ascites. Drainage catheter noted in the right upper quadrant, likely PleurX type catheter.  Review of the MIP images confirms the above findings.  IMPRESSION: Small filling defects and right upper lobe pulmonary arteries compatible with small pulmonary emboli. Associated airspace disease peripherally in the right upper lobe.  Bibasilar atelectasis.  Cardiomegaly.  Large volume ascites in the upper abdomen.   Electronically Signed   By: Charlett NoseKevin  Dover M.D.   On: 04/01/2014 10:15   Dg Chest Port 1 View  03/26/2014   CLINICAL DATA:  Follow-up examination for intubation. Shortness of breath.  EXAM: PORTABLE CHEST - 1 VIEW  COMPARISON:  Prior study from earlier the same day.  FINDINGS: Patient is now intubated with the tip of an endotracheal tube positioned 2.8 cm above the carina. Median sternotomy wires of underlying CABG markers again noted. Cardiac and mediastinal silhouettes are stable. Left IJ approach central venous catheter is stable in position with tip overlying the cavoatrial junction.  Lungs are hypoinflated with bibasilar linear or atelectasis, similar to prior. No definite focal infiltrate. No pneumothorax. No pulmonary edema or pleural effusion.  No acute osseous abnormality.  IMPRESSION: 1. Tip of  the endotracheal tube 2.8 cm above the carina. 2. Shallow lung inflation with bibasilar atelectasis.   Electronically Signed   By: Rise MuBenjamin  McClintock M.D.   On: Aug 31, 2013 05:23   Dg Chest Port 1 View  03/28/2014   CLINICAL DATA:  Central line placement.  Pulmonary emboli.  EXAM: PORTABLE CHEST - 1 VIEW  COMPARISON:  03/08/2014  FINDINGS: Left jugular central line has been placed. Catheter tip in the lower SVC region. There is a catheter in the right upper abdomen. Low lung volumes with basilar atelectasis. Subtle patchy densities in the right upper lung. Negative for a pneumothorax. Heart size is stable.  IMPRESSION: Central line tip in the lower SVC  region. Negative for pneumothorax.  Bilateral volume loss. Subtle patchy densities in the right upper lung.   Electronically Signed   By: Richarda OverlieAdam  Henn M.D.   On: Aug 31, 2013 01:18   Dg Chest Port 1 View  03/30/2014   CLINICAL DATA:  Syncope.  Hypotensive.  Chronic renal failure  EXAM: PORTABLE CHEST - 1 VIEW  COMPARISON:  January 23, 2014  FINDINGS: There is stable patchy atelectatic change in both lung bases. There is no frank edema or consolidation. The heart size and pulmonary vascularity are normal. No adenopathy. Patient is status post coronary artery bypass grafting.  IMPRESSION: Chronic atelectatic change in the bases, stable. No new opacity. No change in cardiac silhouette.   Electronically Signed   By: Bretta BangWilliam  Woodruff M.D.   On: 03/31/2014 08:33    Anti-infectives: Anti-infectives    Start     Dose/Rate Route Frequency Ordered Stop   04/05/2014 1400  piperacillin-tazobactam (ZOSYN) IVPB 2.25 g     2.25 g100 mL/hr over 30 Minutes Intravenous 3 times per day 04/05/2014 0958     04/02/2014 1200  vancomycin (VANCOCIN) IVPB 750 mg/150 ml premix     750 mg150 mL/hr over 60 Minutes Intravenous Every M-W-F (Hemodialysis) 03/28/2014 0958     03/17/2014 1000  vancomycin (VANCOCIN) 500 mg in sodium chloride 0.9 % 100 mL IVPB     500 mg100 mL/hr over 60 Minutes Intravenous  Once 03/13/2014 0956 03/11/2014 1205   03/12/2014 0830  piperacillin-tazobactam (ZOSYN) IVPB 3.375 g     3.375 g100 mL/hr over 30 Minutes Intravenous  Once 03/30/2014 0823 04/02/2014 1001   03/31/2014 0830  vancomycin (VANCOCIN) IVPB 1000 mg/200 mL premix     1,000 mg200 mL/hr over 60 Minutes Intravenous  Once 03/21/2014 0823 03/13/2014 1102       Assessment/Plan  1. 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 Unfortunately, given the patient's cirrhosis, if his source of sepsis is his abdomen, he would not survive an operation.  There is nothing surgical to offer this patient.  Continue medical care  and support per primary service.   LOS: 1 day    Celestine Prim E 03/12/2014, 8:37 AM Pager: [phone]

## 2014-04-07 NOTE — Progress Notes (Signed)
   REsident confirmed with daughter in pink top - terminal wean. RN confirmed too  Plan ioniitate terminal wean order set   Dr. Kalman ShanMurali Giorgi Debruin, M.D., [facility] Pulmonary and Critical Care Medicine Staff Physician [facility] [place] Pulmonary and Critical Care Pager: [phone], If no answer or between  15:00h - 7:00h: call [phone]  04/05/2014 3:06 PM

## 2014-04-07 DEATH — deceased

## 2016-08-19 IMAGING — CT CT ABD-PELV W/O CM
2 of 4 series · 15 of 46 positions shown, 17 images · non-contrast
Comparison: 01/23/2014

CLINICAL DATA: Abdominal distention and abdominal pain.

EXAM:
CT ABDOMEN AND PELVIS WITHOUT CONTRAST
TECHNIQUE: Multidetector CT imaging of the abdomen and pelvis was performed
following the standard protocol without IV contrast.

[Series 2: abd/ pelvis 5.0 i30f 1 · axial · 0.81mm/px · z∈[-514,+31]mm · 12 of 119 slices shown, 14 images]
[im 5/119  soft-tissue]
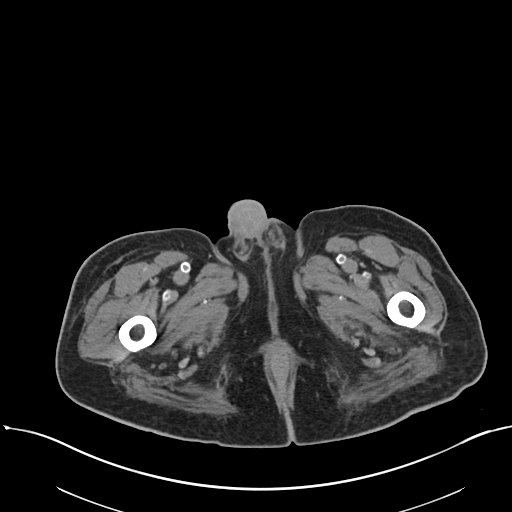
[im 5/119  bone]
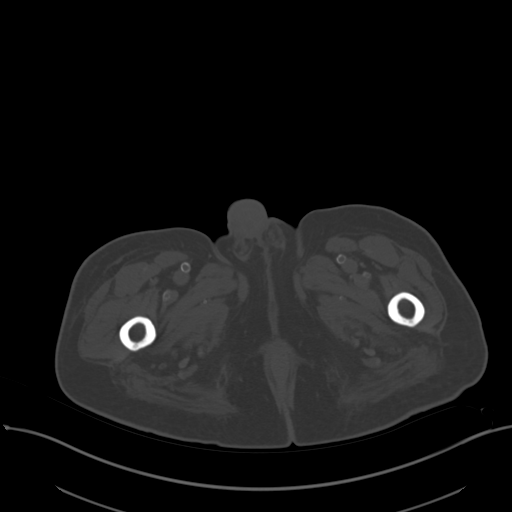
[im 15/119  soft-tissue]
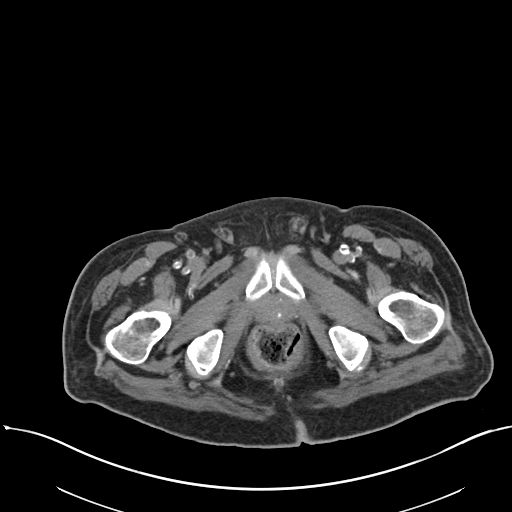
[im 25/119  soft-tissue]
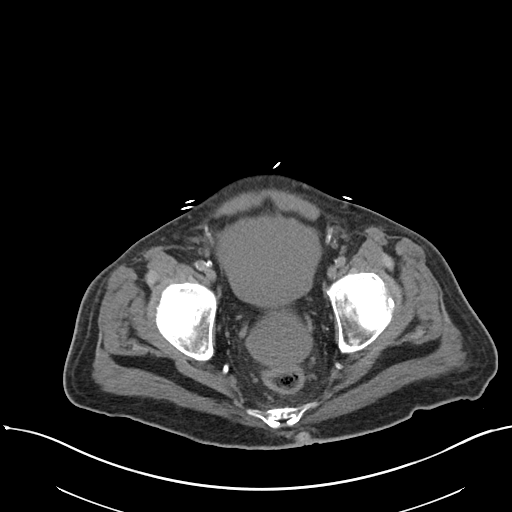
[im 35/119  soft-tissue]
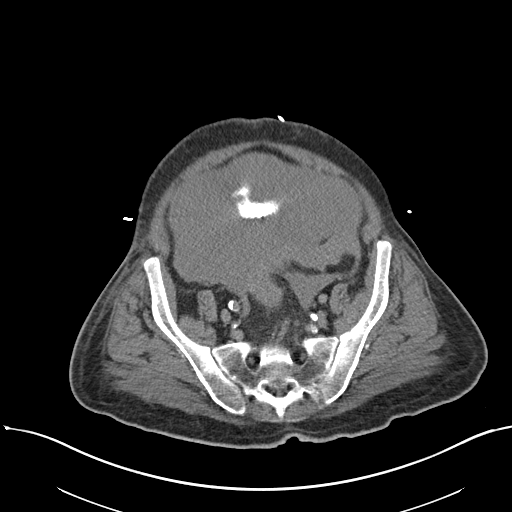
[im 45/119  soft-tissue]
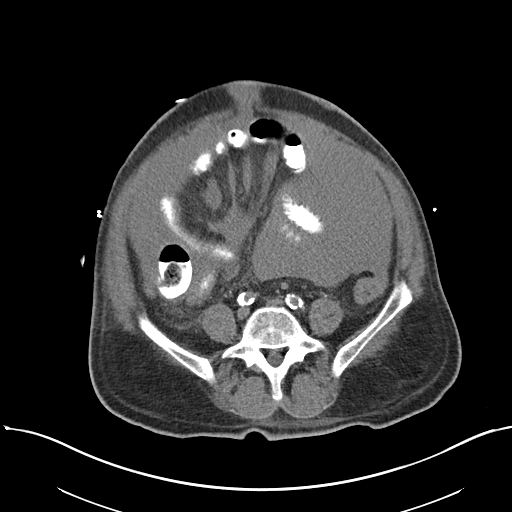
[im 55/119  soft-tissue]
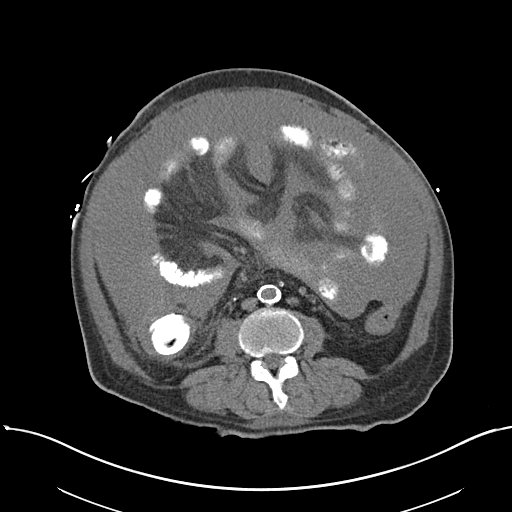
[im 64/119  soft-tissue]
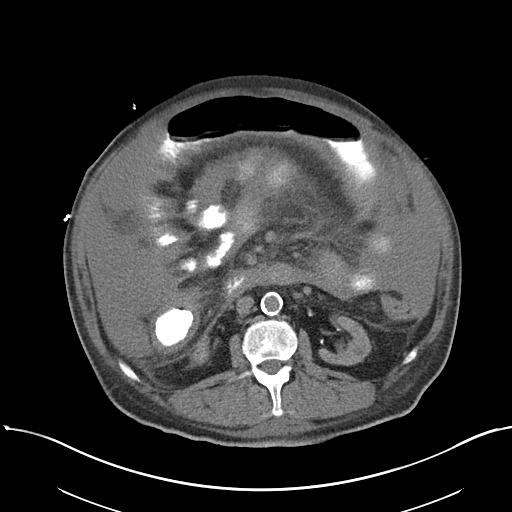
[im 74/119  soft-tissue]
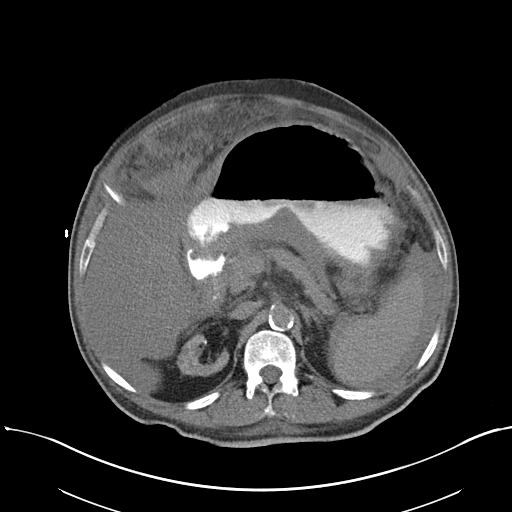
[im 84/119  soft-tissue]
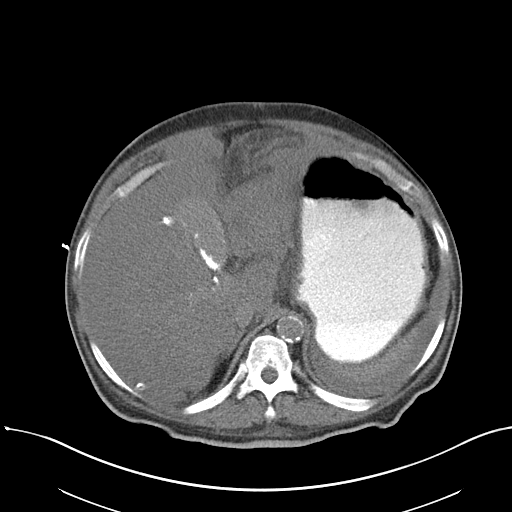
[im 84/119  bone]
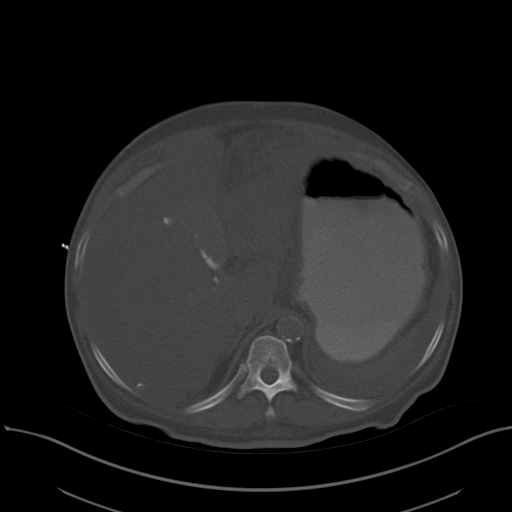
[im 94/119  soft-tissue]
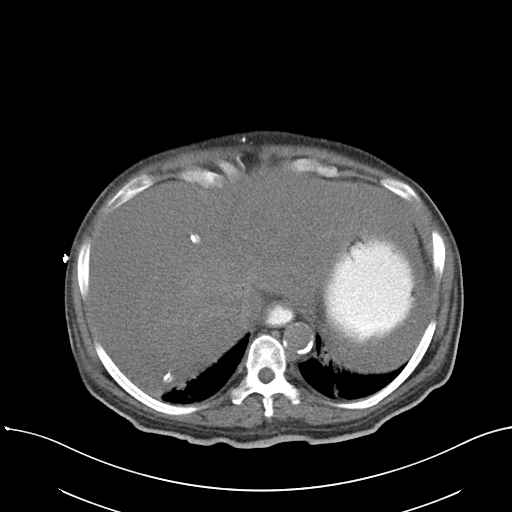
[im 104/119  soft-tissue]
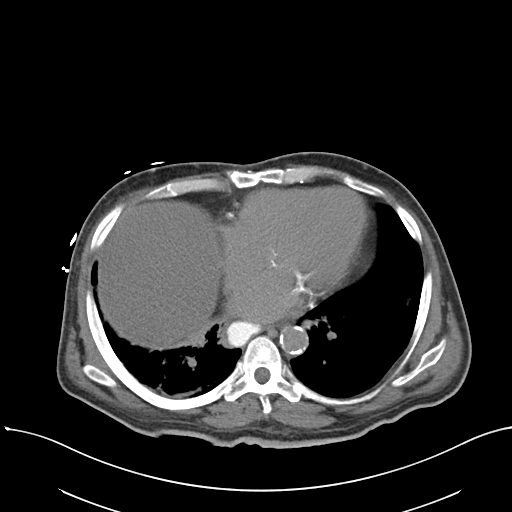
[im 114/119  soft-tissue]
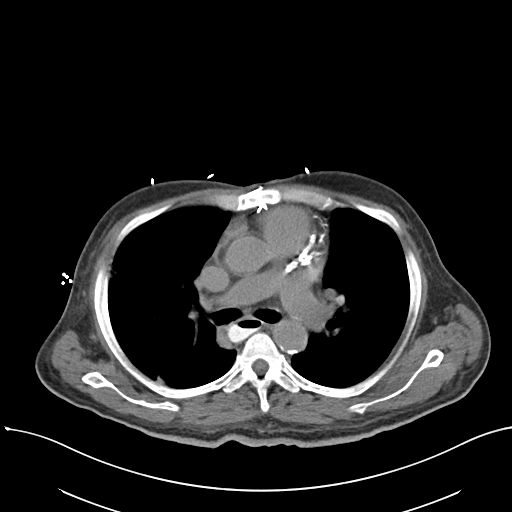

[Series 5: cor st · coronal · 0.89mm/px · 3 of 102 slices shown]
[im 34/102  soft-tissue]
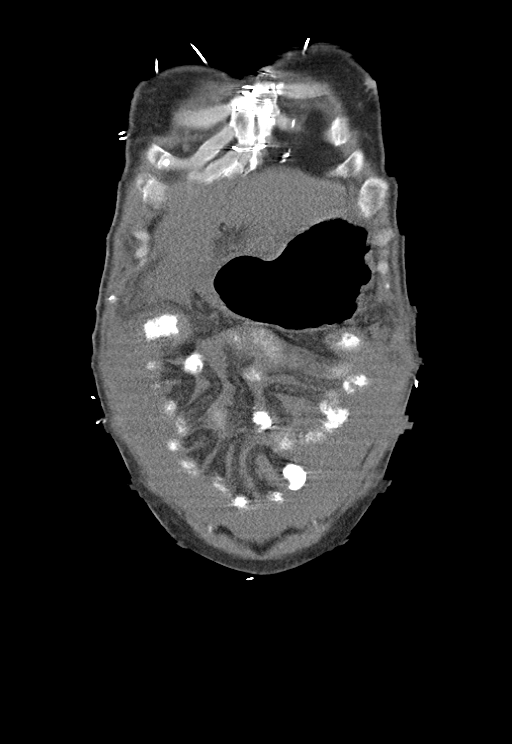
[im 45/102  soft-tissue]
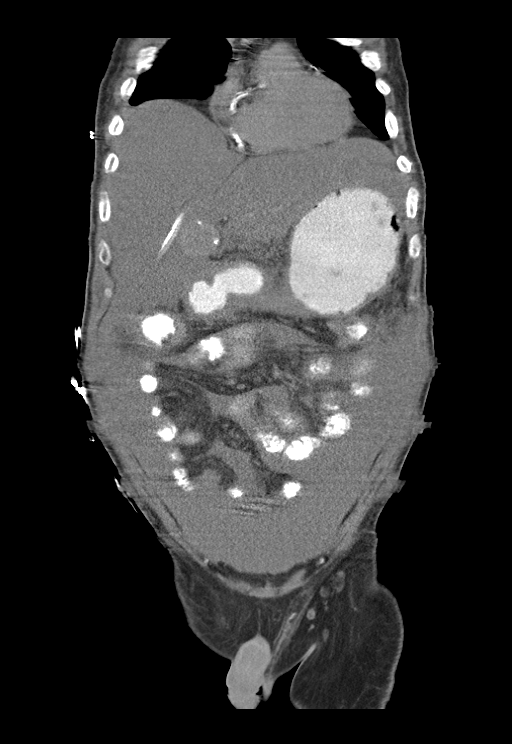
[im 57/102  soft-tissue]
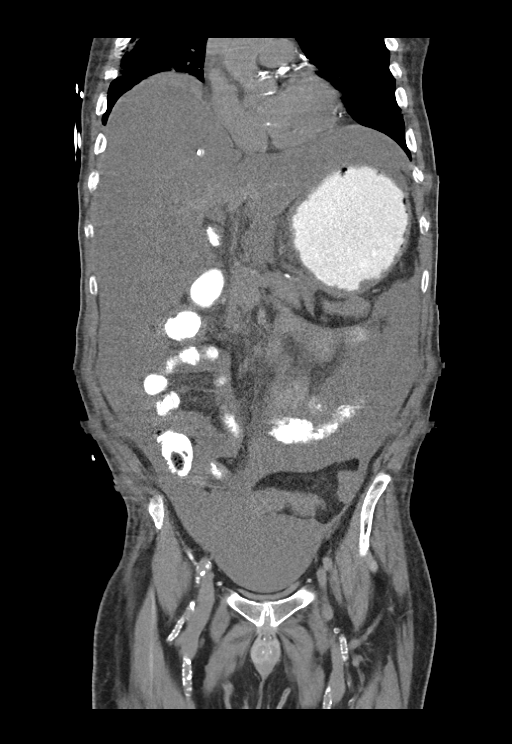

[15 of 46 positions shown; findings below may reference images not displayed]

FINDINGS: Linear infiltrates and atelectasis demonstrated in the lung bases.
Limited visualization due to motion artifact. Esophagus is mildly
dilated and filled with contrast material. This may indicate
esophageal dysmotility or reflux.

A large amount of free fluid in the upper abdomen extending along
the mid abdomen and pericolic gutters into the pelvis. Hounsfield
unit measurements are increased, suggesting complex fluid.
Appearance is most consistent with ascites and is similar to prior
study. Drainage catheter is in place. Cirrhotic configuration of the
liver. Diffuse low-attenuation of the liver makes evaluation of the
liver limited. Spleen size is normal. Cholelithiasis. Unenhanced
appearance of the pancreas, adrenal glands, inferior vena cava, and
retroperitoneal lymph nodes is unremarkable. Bilateral renal
parenchymal atrophy. Vascular calcifications in the kidneys.
Probable parapelvic cyst on the left. No hydronephrosis. Diffuse
calcification of the abdominal aorta without aneurysm. No free air
in the abdomen. Stomach wall is not thickened. Contrast material
flows through to the colon without evidence of small bowel
obstruction. Small bowel are not dilated but there is suggestion of
small bowel wall thickening. Colonic wall thickening particularly in
the transverse region. Changes may be related to ascites or could
indicate enterocolitis.

Pelvis: Calcification in the prostate gland. Bladder is
decompressed. No pelvic mass or lymphadenopathy is identified.
Appendix is not identified. No destructive bone lesions.
IMPRESSION: Large amount of diffuse abdominal and pelvic ascites similar to
prior study. Increased density of fluid suggest proteinaceous fluid.
Hepatic cirrhosis. Cholelithiasis. Thickening of bowel wall and
colon suggesting enteritis/colitis. This may be due to ascites or
may indicate infectious or inflammatory change. Infiltrates and
atelectasis demonstrated in the lung bases. Dilated esophagus.

## 2016-08-20 IMAGING — CR DG CHEST 1V PORT
1 series · 1 of 1 positions shown · non-contrast
Comparison: 03/09/2014

CLINICAL DATA: Central line placement.  Pulmonary emboli.

EXAM:
PORTABLE CHEST - 1 VIEW

[AP]
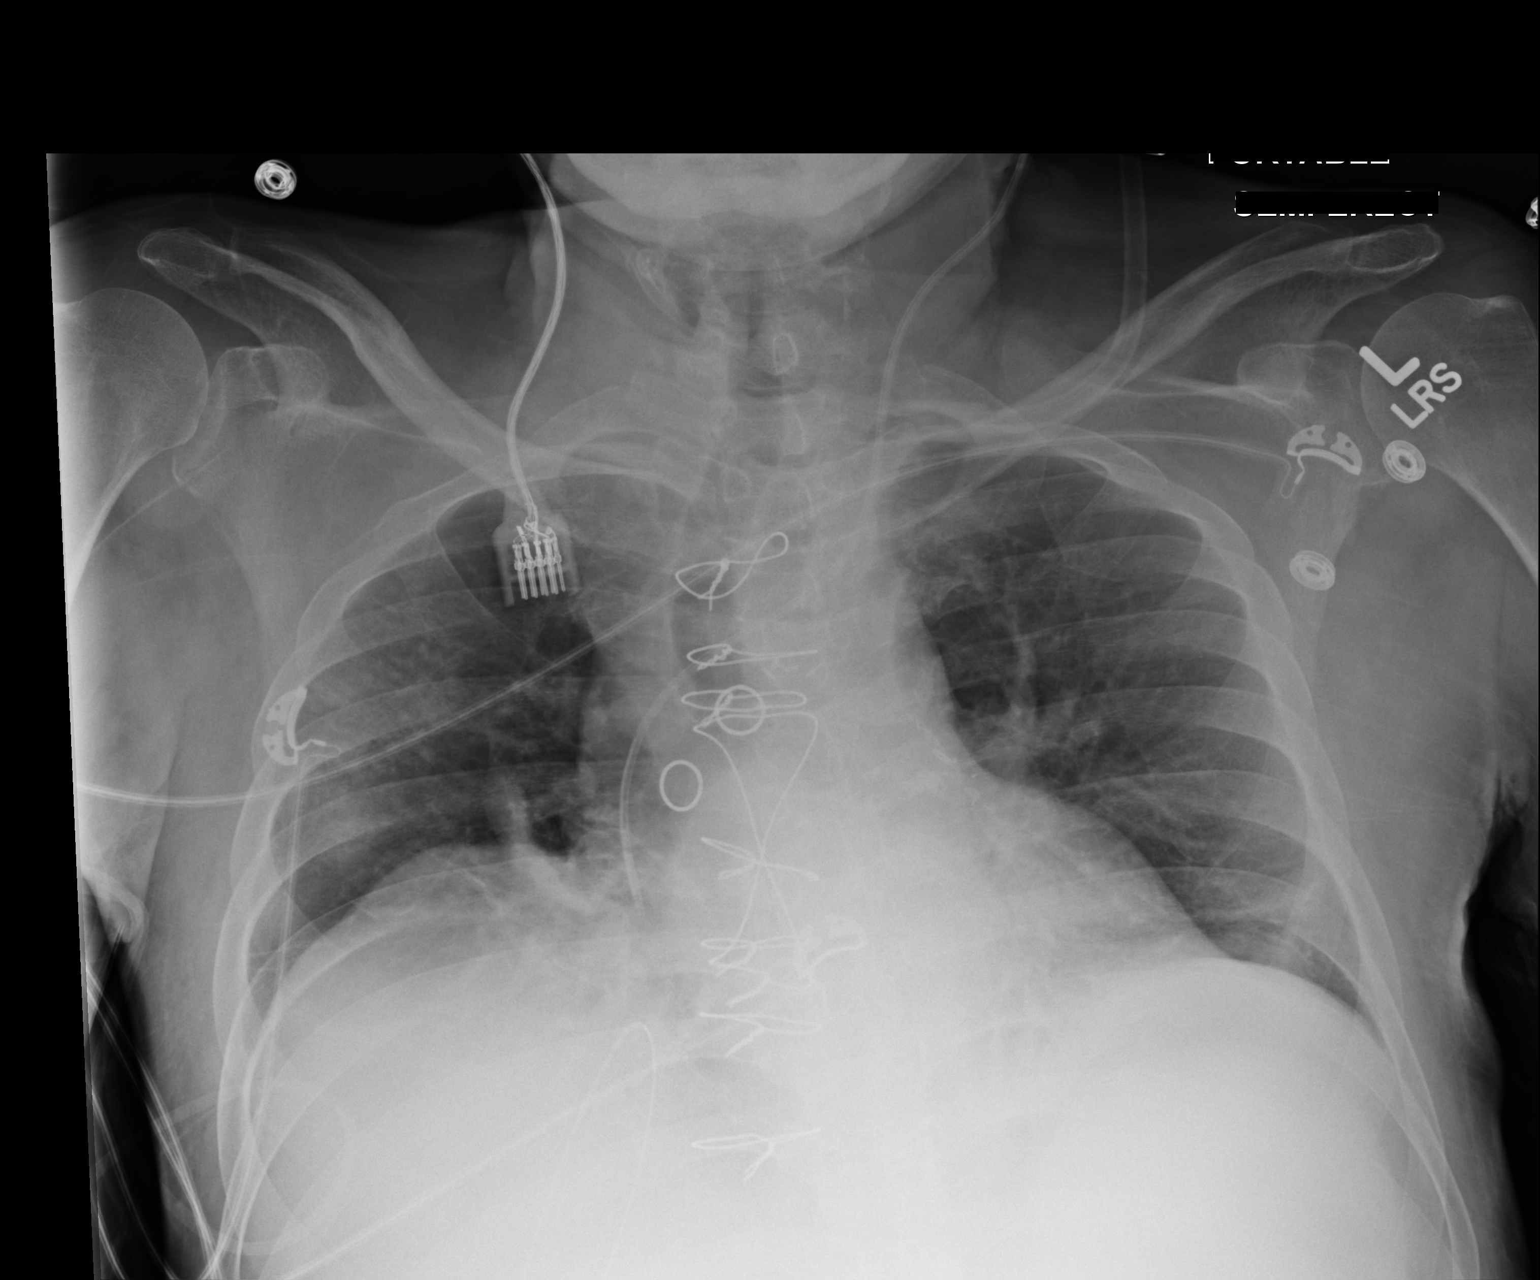

[1 of 1 positions shown; findings below may reference images not displayed]

FINDINGS: Left jugular central line has been placed. Catheter tip in the lower
SVC region. There is a catheter in the right upper abdomen. Low lung
volumes with basilar atelectasis. Subtle patchy densities in the
right upper lung. Negative for a pneumothorax. Heart size is stable.
IMPRESSION: Central line tip in the lower SVC region. Negative for pneumothorax.

Bilateral volume loss. Subtle patchy densities in the right upper
lung.
